# Patient Record
Sex: Male | Born: 1970 | Race: White | Hispanic: Yes | Smoking: Never smoker
Health system: Southern US, Community
[De-identification: ages and names within clinical notes are randomized; demographics above are authoritative.]

## PROBLEM LIST (undated history)

## (undated) DIAGNOSIS — J45909 Unspecified asthma, uncomplicated: Secondary | ICD-10-CM

## (undated) DIAGNOSIS — M5441 Lumbago with sciatica, right side: Secondary | ICD-10-CM

## (undated) DIAGNOSIS — F419 Anxiety disorder, unspecified: Secondary | ICD-10-CM

## (undated) DIAGNOSIS — R55 Syncope and collapse: Secondary | ICD-10-CM

## (undated) DIAGNOSIS — M5442 Lumbago with sciatica, left side: Secondary | ICD-10-CM

## (undated) HISTORY — DX: Lumbago with sciatica, right side: M54.42

## (undated) HISTORY — DX: Anxiety disorder, unspecified: F41.9

## (undated) HISTORY — DX: Unspecified asthma, uncomplicated: J45.909

## (undated) HISTORY — DX: Syncope and collapse: R55

## (undated) HISTORY — DX: Lumbago with sciatica, right side: M54.41

---

## 2016-02-18 ENCOUNTER — Ambulatory Visit: Payer: Medicaid Other | Admitting: Family Medicine

## 2016-03-01 ENCOUNTER — Other Ambulatory Visit: Payer: Self-pay

## 2016-03-01 ENCOUNTER — Ambulatory Visit: Payer: Medicaid Other | Attending: Family Medicine | Admitting: Family Medicine

## 2016-03-01 ENCOUNTER — Encounter: Payer: Self-pay | Admitting: Family Medicine

## 2016-03-01 VITALS — BP 104/66 | HR 73 | Temp 98.1°F | Resp 18 | Ht 66.0 in | Wt 151.2 lb

## 2016-03-01 DIAGNOSIS — J45909 Unspecified asthma, uncomplicated: Secondary | ICD-10-CM | POA: Diagnosis not present

## 2016-03-01 DIAGNOSIS — Z8709 Personal history of other diseases of the respiratory system: Secondary | ICD-10-CM

## 2016-03-01 DIAGNOSIS — K219 Gastro-esophageal reflux disease without esophagitis: Secondary | ICD-10-CM | POA: Diagnosis not present

## 2016-03-01 DIAGNOSIS — R1013 Epigastric pain: Secondary | ICD-10-CM | POA: Diagnosis present

## 2016-03-01 DIAGNOSIS — R55 Syncope and collapse: Secondary | ICD-10-CM | POA: Diagnosis not present

## 2016-03-01 LAB — CBC
HEMATOCRIT: 48.1 % (ref 38.5–50.0)
HEMOGLOBIN: 15.6 g/dL (ref 13.2–17.1)
MCH: 30.8 pg (ref 27.0–33.0)
MCHC: 32.4 g/dL (ref 32.0–36.0)
MCV: 94.9 fL (ref 80.0–100.0)
MPV: 9.9 fL (ref 7.5–12.5)
Platelets: 261 10*3/uL (ref 140–400)
RBC: 5.07 MIL/uL (ref 4.20–5.80)
RDW: 12.4 % (ref 11.0–15.0)
WBC: 5.6 10*3/uL (ref 3.8–10.8)

## 2016-03-01 LAB — LIPID PANEL
CHOL/HDL RATIO: 3.9 ratio (ref <5.0)
CHOLESTEROL: 203 mg/dL — AB (ref <200)
HDL: 52 mg/dL (ref >40)
LDL CALC: 131 mg/dL — AB (ref <100)
Triglycerides: 100 mg/dL (ref <150)
VLDL: 20 mg/dL (ref <30)

## 2016-03-01 LAB — BASIC METABOLIC PANEL
BUN: 12 mg/dL (ref 7–25)
CALCIUM: 9.6 mg/dL (ref 8.6–10.3)
CHLORIDE: 102 mmol/L (ref 98–110)
CO2: 29 mmol/L (ref 20–31)
Creat: 0.96 mg/dL (ref 0.60–1.35)
GLUCOSE: 90 mg/dL (ref 65–99)
Potassium: 4.2 mmol/L (ref 3.5–5.3)
SODIUM: 139 mmol/L (ref 135–146)

## 2016-03-01 LAB — LIPASE: LIPASE: 10 U/L (ref 7–60)

## 2016-03-01 MED ORDER — FLUTICASONE PROPIONATE (INHAL) 250 MCG/BLIST IN AEPB: 1.0000 | INHALATION_SPRAY | Freq: Two times a day (BID) | RESPIRATORY_TRACT | 2 refills | Status: DC

## 2016-03-01 MED ORDER — OMEPRAZOLE 20 MG PO CPDR: 20.0000 mg | DELAYED_RELEASE_CAPSULE | Freq: Every day | ORAL | 0 refills | Status: DC

## 2016-03-01 MED ORDER — ALBUTEROL SULFATE HFA 108 (90 BASE) MCG/ACT IN AERS: 2.0000 | INHALATION_SPRAY | RESPIRATORY_TRACT | 2 refills | Status: DC | PRN

## 2016-03-01 MED ORDER — ACETAMINOPHEN 500 MG PO TABS: 1000.0000 mg | ORAL_TABLET | Freq: Three times a day (TID) | ORAL | 0 refills | Status: DC

## 2016-03-01 NOTE — Patient Instructions (Signed)
Asma - Adultos (Asthma, Adult) El asma es una enfermedad de los pulmones en la que las vas respiratorias se estrechan y obstruyen. El asma puede causar dificultad para respirar. El asma no puede curarse, pero los medicamentos y los cambios en el estilo de vida pueden ayudar a controlarla. Los siguientes factores pueden iniciar (desencadenar) el asma:  Escamas de la piel de los animales (caspa).  Polvo.  Cucarachas.  Polen.  Moho.  Humo.  Productos de limpieza.  Aerosoles para el cabello.  Vapores de pintura u olores fuertes.  Aire fro, cambios climticos y vientos.  Llanto o risa intensos.  Estrs.  Ciertos medicamentos o drogas.  Alimentos, como frutas secas, papas fritas y vinos espumantes.  Infecciones o afecciones (resfros, gripe).  Haga actividad fsica.  Ciertas enfermedades o afecciones.  Ejercicio o actividades extenuantes. CUIDADOS EN EL HOGAR  Tome los medicamentos como le indic el mdico.  Use un medidor de flujo espiratorio mximo como le indic su mdico. El medidor de flujo espiratorio mximo es una herramienta que mide el funcionamiento de los pulmones.  Anote y lleve un registro de los valores del medidor de flujo espiratorio mximo.  Conozca el plan de accin para el asma y selo. El plan de accin para el asma es una planificacin por escrito para el control y tratamiento de sus crisis asmticas.  Para prevenir las crisis asmticas: ? No fume. Aljese del humo de otros fumadores. ? Cambie el filtro de la calefaccin y del aire acondicionado con frecuencia. ? Limite el uso de hogares o estufas a lea. ? Elimine las plagas (como cucarachas, ratones) y sus excrementos. ? Elimine las plantas si observa moho en ellas. ? Limpie los pisos. Elimine el polvo regularmente. Use productos de limpieza que sean inoloros. ? Pdale a alguien que pase la aspiradora cuando usted no se encuentre en casa. Utilice una aspiradora con filtros HEPA, siempre que  le sea posible. ? Reemplace las alfombras por pisos de madera, baldosas o vinilo. Las alfombras pueden retener las escamas de la piel de los animales y el polvo. ? Use almohadas, mantas y cubre colchones antialrgicos. ? Lave las sbanas y las mantas todas las semanas con agua caliente y squelas con aire caliente. ? Use mantas de polister o algodn. ? Limpie baos y cocinas con lavandina. Si fuera posible, pdale a alguien que vuelva a pintar las paredes de estas habitaciones con una pintura resistente a los hongos. Aljese de las habitaciones que se estn limpiando y pintando. ? Lvese las manos con frecuencia.  SOLICITE AYUDA SI:  Hace un silbido al respirar (sibilancias), tiene falta de aire o tiene tos incluso despus de tomar los medicamentos para prevenir crisis.  El moco coloreado que elimina (esputo) es ms denso de lo normal.  El moco coloreado que elimina cambia de trasparente o blanco a amarillo, verde, gris o sanguinolento.  Tiene problemas causados por el medicamento que toma, por ejemplo: ? Una erupcin. ? Picazn. ? Hinchazn. ? Problemas respiratorios.  Necesita un medicamento de alivio ms de 2 a 3veces por semana.  El flujo espiratorio mximo an est en 50 a 79% del mejor valor personal despus de seguir el plan de accin durante 1hora.  Tiene fiebre.  SOLICITE AYUDA DE INMEDIATO SI:  Parece empeorar y no responde al medicamento durante una crisis asmtica.  Le falta el aire, incluso en reposo.  Le falta el aire incluso cuando hace muy poca actividad fsica.  Tiene dificultad para comer, beber o hablar.  Siente   el pecho.  La frecuencia cardaca est acelerada.  Sus labios o uas comienzan a Environmental education officer.  Usted se siente mareado, dbil o se desmaya.  Su flujo mximo es Garment/textile technologist del 50% del Pharmacist, hospital personal. Esta informacin no tiene Marine scientist el consejo del mdico. Asegrese de hacerle al mdico cualquier pregunta que  tenga. Document Released: 05/26/2008 Document Revised: 11/18/2014 Document Reviewed: 09/26/2012 Elsevier Interactive Patient Education  2017 Junior de alimentos para pacientes con reflujo gastroesofgico - Adultos (Food Choices for Gastroesophageal Reflux Disease, Adult) Cuando se tiene reflujo gastroesofgico (ERGE), los alimentos que se ingieren y los hbitos de alimentacin son Theatre stage manager. Elegir los alimentos adecuados puede ayudar a [employer]. [place]?  Elija las frutas, los vegetales, los cereales integrales y los productos lcteos con bajo contenido de [place].  [place], de pescado y de ave con bajo contenido de grasas.  Limite las grasas, como los [place], los aderezos para [place], la [place], los frutos secos y Publishing copy.  Lleve un registro de alimentos. Esto ayuda a identificar los alimentos que ocasionan sntomas.  Evite los alimentos que le ocasionen sntomas. Pueden ser distintos para cada persona.  Haga comidas pequeas durante Psychiatrist de 3 comidas abundantes.  Coma lentamente, en un lugar donde est distendido.  Limite el consumo de alimentos fritos.  Cocine los alimentos utilizando mtodos que no sean la fritura.  Evite el consumo alcohol.  Evite beber grandes cantidades de lquidos con las comidas.  Evite agacharse o recostarse hasta despus de 2 o 3horas de haber comido. QU ALIMENTOS NO SE RECOMIENDAN? Estos son algunos alimentos y bebidas que pueden empeorar los sntomas: Actor. Jugo de tomate. Salsa de tomate y espagueti. Ajes. Cebolla y [place]. Rbano picante. Frutas  Naranjas, pomelos y limn (fruta y [place]). Carnes  Carnes de [place], de pescado y de ave con gran contenido de grasas. Esto incluye los perros calientes, las [place], el [place], la salchicha, el salame y el tocino. Lcteos  Leche entera y [place]. [employer]. Crema. [place]. Helados. Queso  crema. Bebidas  T o caf. Bebidas gaseosas o bebidas energizantes. Condimentos  Salsa picante. Salsa barbacoa. Dulces/postres  Chocolate y cacao. Rosquillas. Menta y mentol. Grasas y [employer]. Esto incluye las papas fritas. Otros  [place]. Especias picantes. Esto incluye la pimienta negra, la pimienta blanca, la pimienta roja, la pimienta de cayena, el curry en polvo, los clavos de [place], el jengibre y el [place] en polvo. Esta no es [employer] de los alimentos y las bebidas que se Higher education careers adviser. Comunquese con el nutricionista para recibir ms informacin.  Esta informacin no tiene Marine scientist el consejo del mdico. Asegrese de hacerle al mdico cualquier pregunta que tenga. Document Released: 08/29/2011 Document Revised: 03/20/2014 Document Reviewed: 01/01/2013 Elsevier Interactive Patient Education  2017 [employer].

## 2016-03-01 NOTE — Progress Notes (Signed)
Subjective:  Patient ID: Derek Smith, male    DOB: 08-28-1970  Age: 45 y.o. MRN: [id number]  CC: Establish Care  HPI Angeldaniel Pepitone comes to the office to establish care. He has c/o epigastric abdominal pain for 7 months. He reports pain increases after he eats pizza, spicy, or greasy foods. He also reports foul-smelling breath. He also has complaints of occasional chest pain with episodes of syncope. He reports these symptoms have been happening for 3 years. He reports when he has the pain it radiates to his left arm and down his back causing weakness.He also reports a past medical history of asthma. He denies having any medications for asthma available and reports he has been using his wife's albuterol and flovent inhalers the past 2 weeks. He denies smoking.  No outpatient prescriptions prior to visit.   No facility-administered medications prior to visit.     ROS Review of Systems  Constitutional: Negative.   HENT: Negative for trouble swallowing.   Respiratory:       Pt.reports hx.of asthma.  Cardiovascular:       Pt.reports 3 year history of occasional syncopal episodes with chest pain    Objective:  BP 104/66 (BP Location: Right Arm, Patient Position: Sitting, Cuff Size: Normal)   Pulse 73   Temp 98.1 F (36.7 C) (Oral)   Resp 18   Ht 5\' 6"  (1.676 m)   Wt 151 lb 3.2 oz (68.6 kg)   SpO2 98%   BMI 24.40 kg/m   BP/Weight [id number]  Systolic BP 123456  Diastolic BP 66  Wt. (Lbs) 151.2  BMI 24.4   Physical Exam  Constitutional: He appears well-developed and well-nourished.  Neck: Normal range of motion. No JVD present.  Cardiovascular: Normal rate, regular rhythm, normal heart sounds and intact distal pulses.   Pulmonary/Chest: Effort normal and breath sounds normal. No respiratory distress. He has no wheezes.  Abdominal: Soft. Bowel sounds are normal. There is tenderness (epigastric).  Pulsations noted mid- abdomen.   Assessment & Plan:   1. Syncope, unspecified  syncope type - EKG [id number] - Basic Metabolic Panel - [place] AAA DUPLEX COMPLETE; Future  2. Abdominal pain, epigastric - Basic Metabolic Panel - CBC - [place] AAA DUPLEX COMPLETE; Future - Lipid Panel - Lipase - H.pylori screen, POC; Future  3. Gastroesophageal reflux disease, esophagitis presence not specified - omeprazole (PRILOSEC) 20 MG capsule; Take 1 capsule (20 mg total) by mouth daily.  Dispense: 30 capsule; Refill: 0  4. History of asthma - albuterol (PROVENTIL HFA;VENTOLIN HFA) 108 (90 Base) MCG/ACT inhaler; Inhale 2 puffs into the lungs every 4 (four) hours as needed for wheezing or shortness of breath.  Dispense: 1 Inhaler; Refill: 2 - Fluticasone Propionate, Inhal, (FLOVENT DISKUS) 250 MCG/BLIST AEPB; Inhale 1 Inhaler into the lungs 2 (two) times daily.  Dispense: 30 each; Refill: 2  Meds ordered this encounter  Medications  . DISCONTD: albuterol (PROVENTIL HFA;VENTOLIN HFA) 108 (90 Base) MCG/ACT inhaler    Sig: Inhale 2 puffs into the lungs every 4 (four) hours as needed for wheezing or shortness of breath.    Dispense:  1 Inhaler    Refill:  2    Order Specific Question:   Supervising Provider    Answer:   Tresa Garter [id number]  . DISCONTD: omeprazole (PRILOSEC) 20 MG capsule    Sig: Take 1 capsule (20 mg total) by mouth daily.    Dispense:  30 capsule    Refill:  0  Order Specific Question:   Supervising Provider    Answer:   Tresa Garter [id number]  . DISCONTD: Fluticasone Propionate, Inhal, (FLOVENT DISKUS) 250 MCG/BLIST AEPB    Sig: Inhale 1 Inhaler into the lungs 2 (two) times daily.    Dispense:  30 each    Refill:  2    Order Specific Question:   Supervising Provider    Answer:   Tresa Garter [id number]  . omeprazole (PRILOSEC) 20 MG capsule    Sig: Take 1 capsule (20 mg total) by mouth daily.    Dispense:  30 capsule    Refill:  0    Order Specific Question:   Supervising Provider    Answer:   Tresa Garter [id number]  .  albuterol (PROVENTIL HFA;VENTOLIN HFA) 108 (90 Base) MCG/ACT inhaler    Sig: Inhale 2 puffs into the lungs every 4 (four) hours as needed for wheezing or shortness of breath.    Dispense:  1 Inhaler    Refill:  2    Order Specific Question:   Supervising Provider    Answer:   Tresa Garter [id number]  . Fluticasone Propionate, Inhal, (FLOVENT DISKUS) 250 MCG/BLIST AEPB    Sig: Inhale 1 Inhaler into the lungs 2 (two) times daily.    Dispense:  30 each    Refill:  2    Order Specific Question:   Supervising Provider    Answer:   Tresa Garter [id number]  . acetaminophen (TYLENOL) 500 MG tablet    Sig: Take 2 tablets (1,000 mg total) by mouth every 8 (eight) hours.    Dispense:  90 tablet    Refill:  0    Order Specific Question:   Supervising Provider    Answer:   Tresa Garter [id number]   Follow-up: Return in about 1 month (around 04/01/2016) for GERD.   Alfonse Spruce FNP

## 2016-03-01 NOTE — Progress Notes (Signed)
Patient is here to establish care.  Patient takes gabapentin.  Patient experienced lower back pain with pain radiating down his legs.

## 2016-03-03 ENCOUNTER — Encounter: Payer: Self-pay | Admitting: Family Medicine

## 2016-03-03 NOTE — Progress Notes (Signed)
Please inform patient of labs including his kidney, pancreas, fluid, blood cell and electrolytes are normal. Patient cholesterol level is slightly high and he should limit his saturated fat intake and increase his fiber and exercise.

## 2016-03-10 ENCOUNTER — Telehealth: Payer: Self-pay

## 2016-03-10 NOTE — Telephone Encounter (Signed)
CMA left a message that was trying to go over his results and left a number to call back.

## 2016-03-10 NOTE — Telephone Encounter (Signed)
Patient Verify DOB  Patient return phone call and I inform about his results being normal, and also that his Cholesterol level was slightly high and that he should limit his saturated fat and intake increase his fiber and exercise.

## 2016-03-10 NOTE — Telephone Encounter (Signed)
-----   Message from [place], [place] sent at 03/03/2016 12:02 PM EST ----- Please inform patient of labs including his kidney, pancreas, fluid, blood cell and electrolytes are normal. Patient cholesterol level is slightly high and he should limit his saturated fat intake and increase his fiber and exercise.

## 2016-03-15 ENCOUNTER — Ambulatory Visit (HOSPITAL_COMMUNITY): Payer: Medicaid Other

## 2016-03-15 ENCOUNTER — Encounter (HOSPITAL_COMMUNITY): Payer: Self-pay

## 2016-03-15 ENCOUNTER — Other Ambulatory Visit: Payer: Self-pay | Admitting: Family Medicine

## 2016-03-15 ENCOUNTER — Ambulatory Visit (HOSPITAL_COMMUNITY)
Admission: RE | Admit: 2016-03-15 | Discharge: 2016-03-15 | Disposition: A | Discharge status: Home / Self Care | Payer: Medicaid Other | Source: Ambulatory Visit | Attending: Family Medicine | Admitting: Family Medicine

## 2016-03-15 DIAGNOSIS — R079 Chest pain, unspecified: Secondary | ICD-10-CM

## 2016-03-15 DIAGNOSIS — R55 Syncope and collapse: Secondary | ICD-10-CM

## 2016-03-15 DIAGNOSIS — R1013 Epigastric pain: Secondary | ICD-10-CM

## 2016-03-15 DIAGNOSIS — R0989 Other specified symptoms and signs involving the circulatory and respiratory systems: Secondary | ICD-10-CM

## 2016-03-17 ENCOUNTER — Ambulatory Visit (HOSPITAL_COMMUNITY): Admission: RE | Admit: 2016-03-17 | Payer: Medicaid Other | Source: Ambulatory Visit

## 2016-03-20 ENCOUNTER — Telehealth: Payer: Self-pay | Admitting: Family Medicine

## 2016-03-20 NOTE — Telephone Encounter (Signed)
Good Morning  [employer] former Medicaid denied the procedure US Aorta 04-27-15 @ 9:30am Please, contact [employer] if you want to appeal the decision [id number] or please, contact the patient and radiology [phone]  to cancel the appointment Thank You .  Based on [employer] [facility] [employer] Guidelines, we are unable to approve the requested procedure. There is an approval code for the same study, or a similar study, that is on file in the members records. This approval is still in effect. The clinical information submitted does not describe results of this prior imaging, or if there are plans to use this approval. The approved study may be sufficient for the evaluation of the current clinical condition. Guidelines do not support additional imaging without knowing the outcome of the prior approval and, therefore, the requested study is not indicated at this time.

## 2016-03-28 ENCOUNTER — Other Ambulatory Visit: Payer: Self-pay | Admitting: Family Medicine

## 2016-03-28 DIAGNOSIS — R55 Syncope and collapse: Secondary | ICD-10-CM

## 2016-03-28 HISTORY — DX: Syncope and collapse: R55

## 2016-04-11 NOTE — Telephone Encounter (Signed)
-----   Message from Alfonse Spruce, [place] sent at 03/28/2016  5:08 PM EST ----- Regarding: Scheduling for imaging studies Please notify patient that since his previous imaging study was denied by insurance I have placed orders for him to receive an echocardiogram and a carotid doppler study for his history of syncope (fainting). Ask what days of the week and time of day he prefers for his studies.

## 2016-04-11 NOTE — Telephone Encounter (Signed)
CMA call to get patient best time and day to scheduled an echocardiogram  Patient did not answer left a VM stating what I need and to call me back

## 2016-04-12 NOTE — Telephone Encounter (Signed)
Patient returned CMA's call stating that the best days to get echocardiogram are Wednesdays after 1pm. Please follow up.  Thank you.

## 2016-04-13 ENCOUNTER — Other Ambulatory Visit: Payer: Self-pay | Admitting: Family Medicine

## 2016-04-13 DIAGNOSIS — R55 Syncope and collapse: Secondary | ICD-10-CM

## 2016-04-13 NOTE — Telephone Encounter (Signed)
Okay I will change it now.

## 2016-04-13 NOTE — Telephone Encounter (Signed)
CMA call to inform him his echocardiogram/Cartoid Doppler appt on 02/14 @ [facility] first one @ 1pm the next one @ 2 pm  Patient did not answer but cma left a VM stating the date and time of the appt  And to call us back if have any questions'

## 2016-04-26 ENCOUNTER — Ambulatory Visit (HOSPITAL_COMMUNITY)
Admission: RE | Admit: 2016-04-26 | Discharge: 2016-04-26 | Disposition: A | Discharge status: Home / Self Care | Payer: Medicaid Other | Source: Ambulatory Visit | Attending: Family Medicine | Admitting: Family Medicine

## 2016-04-26 ENCOUNTER — Encounter (HOSPITAL_COMMUNITY): Payer: Self-pay | Admitting: Family Medicine

## 2016-04-26 ENCOUNTER — Ambulatory Visit (HOSPITAL_BASED_OUTPATIENT_CLINIC_OR_DEPARTMENT_OTHER)
Admission: RE | Admit: 2016-04-26 | Discharge: 2016-04-26 | Disposition: A | Discharge status: Home / Self Care | Payer: Medicaid Other | Source: Ambulatory Visit | Attending: Family Medicine | Admitting: Family Medicine

## 2016-04-26 ENCOUNTER — Other Ambulatory Visit: Payer: Self-pay | Admitting: Family Medicine

## 2016-04-26 ENCOUNTER — Ambulatory Visit: Payer: Medicaid Other | Attending: Physician Assistant | Admitting: Physician Assistant

## 2016-04-26 ENCOUNTER — Ambulatory Visit (HOSPITAL_COMMUNITY): Payer: Medicaid Other

## 2016-04-26 VITALS — BP 119/74 | HR 74 | Temp 98.4°F | Ht 68.0 in | Wt 154.0 lb

## 2016-04-26 DIAGNOSIS — Z79899 Other long term (current) drug therapy: Secondary | ICD-10-CM | POA: Insufficient documentation

## 2016-04-26 DIAGNOSIS — R55 Syncope and collapse: Secondary | ICD-10-CM | POA: Diagnosis not present

## 2016-04-26 DIAGNOSIS — I5189 Other ill-defined heart diseases: Secondary | ICD-10-CM | POA: Insufficient documentation

## 2016-04-26 DIAGNOSIS — M5442 Lumbago with sciatica, left side: Secondary | ICD-10-CM

## 2016-04-26 DIAGNOSIS — K219 Gastro-esophageal reflux disease without esophagitis: Secondary | ICD-10-CM

## 2016-04-26 DIAGNOSIS — I361 Nonrheumatic tricuspid (valve) insufficiency: Secondary | ICD-10-CM | POA: Insufficient documentation

## 2016-04-26 DIAGNOSIS — F419 Anxiety disorder, unspecified: Secondary | ICD-10-CM

## 2016-04-26 LAB — POCT URINALYSIS DIPSTICK
Bilirubin, UA: NEGATIVE
Glucose, UA: NEGATIVE
Ketones, UA: NEGATIVE
Leukocytes, UA: NEGATIVE
Nitrite, UA: NEGATIVE
Protein, UA: NEGATIVE
SPEC GRAV UA: 1.015
UROBILINOGEN UA: 0.2
pH, UA: 6.5

## 2016-04-26 MED ORDER — PREDNISONE 10 MG (21) PO TBPK: 10.0000 mg | ORAL_TABLET | Freq: Every day | ORAL | 0 refills | Status: DC

## 2016-04-26 MED ORDER — CLONAZEPAM 0.5 MG PO TABS: 0.5000 mg | ORAL_TABLET | Freq: Two times a day (BID) | ORAL | 1 refills | Status: DC | PRN

## 2016-04-26 MED ORDER — ESCITALOPRAM OXALATE 10 MG PO TABS: 10.0000 mg | ORAL_TABLET | Freq: Every day | ORAL | 1 refills | Status: DC

## 2016-04-26 MED ORDER — ACETAMINOPHEN 500 MG PO TABS: 1000.0000 mg | ORAL_TABLET | Freq: Three times a day (TID) | ORAL | 0 refills | Status: AC | PRN

## 2016-04-26 NOTE — Patient Instructions (Signed)
   Trastorno de ansiedad generalizada (Generalized Anxiety Disorder) El trastorno de ansiedad generalizada es un trastorno mental. Interfiere en las funciones vitales, incluyendo las relaciones, el trabajo y la escuela.  Es diferente de la ansiedad normal que todas las personas experimentan en algn momento de su vida en respuesta a sucesos y actividades especficas. En verdad, la ansiedad normal nos ayuda a prepararnos y atravesar estos acontecimientos y actividades de la vida. La ansiedad normal desaparece despus de que el evento o la actividad ha finalizado.  El trastorno de ansiedad generalizada no est necesariamente relacionada con eventos o actividades especficas. Tambin causa un exceso de ansiedad en proporcin a sucesos o actividades especficas. En este trastorno la ansiedad es difcil de controlar. Los sntomas pueden variar de leves a muy graves. Las personas que sufren de trastorno de ansiedad generalizada pueden tener intensas olas de ansiedad con sntomas fsicos (ataques de pnico).  SNTOMAS  La ansiedad y la preocupacin asociada a este trastorno son difciles de controlar. Esta ansiedad y la preocupacin estn relacionados con muchos eventos de la vida y sus actividades y tambin ocurre durante ms das de los que no ocurre, durante 6 meses o ms. Las personas que la sufren pueden tener tres o ms de los siguientes sntomas (uno o ms en los nios):   Agitacin   Fatiga.  Dificultades de concentracin.   Irritabilidad.  Tensin muscular  Dificultad para dormirse o sueo poco satisfactorio. DIAGNSTICO  Se diagnostica a travs de una evaluacin realizada por el mdico. El mdico le har preguntas acerca de su estado de nimo, sntomas fsicos y sucesos de su vida. Le har preguntas sobre su historia clnica, el consumo de alcohol o drogas, incluyendo los medicamentos recetados. Tambin le har un examen fsico e indicar anlisis de sangre. Ciertas enfermedades y el uso de  determinadas sustancias pueden causar sntomas similares a este trastorno. Su mdico lo puede derivar a un especialista en salud mental para una evaluacin ms profunda..  TRATAMIENTO  Las terapias siguientes se utilizan en el tratamiento de este trastorno:   Medicamentos - Se recetan antidepresivos para el control diario a largo plazo. Pueden indicarse tambin medicamentos para combatir la ansiedad en los casos graves, especialmente cuando ocurren ataques de pnico.   Terapia conversada (psicoterapia) Ciertos tipos de psicoterapia pueden ser tiles en el tratamiento del trastorno de ansiedad generalizada, proporcionando apoyo, educacin y orientacin. Una forma de psicoterapia llamada terapia cognitivo-conductual puede ensearle formas saludables de pensar y reaccionar a los eventos y actividades de la vida diaria.  Tcnicasde manejo del estrs- Estas tcnicas incluyen el yoga, la meditacin y el ejercicio y pueden ser muy tiles cuando se practican con regularidad. Un especialista en salud mental puede ayudar a determinar qu tratamiento es mejor para usted. Algunas personas obtienen mejora con una terapia. Sin embargo, otras personas requieren una combinacin de terapias.  Esta informacin no tiene como fin reemplazar el consejo del mdico. Asegrese de hacerle al mdico cualquier pregunta que tenga. Document Released: 06/24/2012 Document Revised: 03/20/2014 Elsevier Interactive Patient Education  2017 Elsevier Inc.  

## 2016-04-26 NOTE — Progress Notes (Signed)
Subjective:  Patient ID: Derek Smith, male    DOB: [birth year]  Age: 46 y.o. MRN: [id number]  CC: back pain  HPI Derek Smith is a 46 y.o. male with a PMH of Anxiety and syncope that presents with several weeks of lower back pain with radiculopathy in the posterior aspect of left leg down to the mid hamstring. There is also some weakness in the left leg at times of pain. Radicular pain is sometimes felt anteriorly to the upper quadriceps. He is able to ambulate but with pain. Currently has mild discomfort in the lower back. 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 There is associated stiffness with the back pain. Denies GI/GU sxs, saddle paresthesia, paralysis, rash, fever, chills, abdominal pain, chest pain, SOB, or headache.    Outpatient Medications Prior to Visit  Medication Sig Dispense Refill  . albuterol (PROVENTIL HFA;VENTOLIN HFA) 108 (90 Base) MCG/ACT inhaler Inhale 2 puffs into the lungs every 4 (four) hours as needed for wheezing or shortness of breath. 1 Inhaler 2  . Fluticasone Propionate, Inhal, (FLOVENT DISKUS) 250 MCG/BLIST AEPB Inhale 1 Inhaler into the lungs 2 (two) times daily. 30 each 2  . omeprazole (PRILOSEC) 20 MG capsule Take 1 capsule (20 mg total) by mouth daily. 30 capsule 0  . acetaminophen (TYLENOL) 500 MG tablet Take 2 tablets (1,000 mg total) by mouth every 8 (eight) hours. 90 tablet 0   No facility-administered medications prior to visit.      ROS Review of Systems  Constitutional: Negative for chills, fever and malaise/fatigue.  Respiratory: Negative for shortness of breath.   Cardiovascular: Negative for chest pain.  Gastrointestinal: Negative for abdominal pain, nausea and vomiting.  Genitourinary: Negative for dysuria.  Musculoskeletal: Positive for back pain.  Skin: Negative for rash.  Neurological: Positive for focal weakness. Negative for headaches.       Left sided sciatic radiculopathy  Psychiatric/Behavioral: The patient is  nervous/anxious.     Objective:  BP 119/74   Pulse 74   Temp 98.4 F (36.9 C) (Oral)   Ht 5\' 8"  (1.727 m)   Wt 154 lb (69.9 kg)   SpO2 98%   BMI 23.42 kg/m   BP/Weight 04/26/2016 [id number]  Systolic BP 123456 123456  Diastolic BP 74 66  Wt. (Lbs) 154 151.2  BMI 23.42 24.4      Physical Exam  Constitutional: He is oriented to person, place, and time.  NAD, normal body habitus, polite  HENT:  Head: Normocephalic and atraumatic.  Eyes: No scleral icterus.  Neck: No thyromegaly present.  Cardiovascular: Normal rate, regular rhythm and normal heart sounds.   Pulmonary/Chest: Effort normal and breath sounds normal.  Musculoskeletal: He exhibits no edema.  Mildly increased muscular tonicity in the paraspinals of the lower T spine and also in the L spine  Neurological: He is alert and oriented to person, place, and time.  SLR positive on left side  Skin: Skin is warm and dry.  Psychiatric: He has a normal mood and affect. His behavior is normal. Thought content normal.     Assessment & Plan:   1. Acute bilateral low back pain with left-sided sciatica - Follow up in three weeks. Will consider PT if no better - predniSONE (STERAPRED UNI-PAK 21 TAB) 10 MG (21) TBPK tablet; Take 1 tablet (10 mg total) by mouth daily.  Dispense: 21 tablet; Refill: 0 - acetaminophen (TYLENOL) 500 MG tablet; Take 2 tablets (1,000 mg total) by mouth every 8 (eight)  hours as needed.  Dispense: 30 tablet; Refill: 0 - Urinalysis Dipstick  2. Anxiety - Previously untreated. GAD 7 score of 10. Follow up in 3 weeks to assess efficacy of pharmacotherapy. - clonazePAM (KLONOPIN) 0.5 MG tablet; Take 1 tablet (0.5 mg total) by mouth 2 (two) times daily as needed for anxiety.  Dispense: 20 tablet; Refill: 1 - escitalopram (LEXAPRO) 10 MG tablet; Take 1 tablet (10 mg total) by mouth daily.  Dispense: 30 tablet; Refill: 1   Meds ordered this encounter  Medications  . clonazePAM (KLONOPIN) 0.5 MG tablet    Sig:  Take 1 tablet (0.5 mg total) by mouth 2 (two) times daily as needed for anxiety.    Dispense:  20 tablet    Refill:  1    Order Specific Question:   Supervising Provider    Answer:   Tresa Garter [id number]  . escitalopram (LEXAPRO) 10 MG tablet    Sig: Take 1 tablet (10 mg total) by mouth daily.    Dispense:  30 tablet    Refill:  1    Order Specific Question:   Supervising Provider    Answer:   Tresa Garter [id number]  . predniSONE (STERAPRED UNI-PAK 21 TAB) 10 MG (21) TBPK tablet    Sig: Take 1 tablet (10 mg total) by mouth daily.    Dispense:  21 tablet    Refill:  0    Order Specific Question:   Supervising Provider    Answer:   Tresa Garter [id number]  . acetaminophen (TYLENOL) 500 MG tablet    Sig: Take 2 tablets (1,000 mg total) by mouth every 8 (eight) hours as needed.    Dispense:  30 tablet    Refill:  0    Order Specific Question:   Supervising Provider    Answer:   Tresa Garter [id number]    Follow-up: Return in about 3 years (around 04/27/2019) for back pain and anxiety.   Clent Demark PA

## 2016-04-26 NOTE — Progress Notes (Signed)
  Echocardiogram 2D Echocardiogram has been performed.  Darlina Sicilian M 04/26/2016, 1:41 PM

## 2016-04-26 NOTE — Progress Notes (Signed)
Preliminary results by tech - Carotid Duplex Completed. No evidence of stenosis in bilateral carotid arteries. Vertebral arteries demonstrated antegrade flow.  Oda Cogan, BS, RDMS, RVT

## 2016-04-27 ENCOUNTER — Telehealth: Payer: Self-pay

## 2016-04-27 LAB — VAS US CAROTID
LCCADDIAS: 33 cm/s
LEFT ECA DIAS: -20 cm/s
LEFT VERTEBRAL DIAS: -21 cm/s
LICADDIAS: -26 cm/s
LICADSYS: -52 cm/s
LICAPSYS: -85 cm/s
Left CCA dist sys: 90 cm/s
Left CCA prox dias: 27 cm/s
Left CCA prox sys: 86 cm/s
Left ICA prox dias: -29 cm/s
RCCADSYS: -65 cm/s
RIGHT ECA DIAS: -22 cm/s
RIGHT VERTEBRAL DIAS: -11 cm/s
Right CCA prox dias: -17 cm/s
Right CCA prox sys: -75 cm/s

## 2016-04-27 NOTE — Telephone Encounter (Signed)
Pre authorization confirmation Fax  Case id # [id number]  Status Approved

## 2016-05-02 ENCOUNTER — Other Ambulatory Visit: Payer: Self-pay | Admitting: Family Medicine

## 2016-05-02 DIAGNOSIS — R9430 Abnormal result of cardiovascular function study, unspecified: Secondary | ICD-10-CM

## 2016-05-02 DIAGNOSIS — I071 Rheumatic tricuspid insufficiency: Secondary | ICD-10-CM

## 2016-05-03 ENCOUNTER — Telehealth: Payer: Self-pay

## 2016-05-03 NOTE — Telephone Encounter (Signed)
CMA call to inform echocardiogram results  Patient Verify DOB  Patient was aware and understood

## 2016-05-03 NOTE — Telephone Encounter (Signed)
-----   Message from Alfonse Spruce, [place] sent at 05/02/2016  7:42 PM EST ----- Echocardiogram showed the heart's pumping ability to be mildly reduced. It also showed one of the heart values doesn't close tight enough causing mild backflow in the heart chamber. You will be referred to cardiology. Caroid doppler test which evaluates blood flow to the brain and throughout the blood vessels is normal.

## 2016-05-17 ENCOUNTER — Encounter (HOSPITAL_COMMUNITY): Payer: Self-pay | Admitting: Emergency Medicine

## 2016-05-17 ENCOUNTER — Emergency Department (HOSPITAL_COMMUNITY)
Admission: EM | Admit: 2016-05-17 | Discharge: 2016-05-17 | Disposition: A | Discharge status: Home / Self Care | Payer: Medicaid Other | Attending: Emergency Medicine | Admitting: Emergency Medicine

## 2016-05-17 ENCOUNTER — Emergency Department (HOSPITAL_COMMUNITY): Payer: Medicaid Other

## 2016-05-17 DIAGNOSIS — X58XXXA Exposure to other specified factors, initial encounter: Secondary | ICD-10-CM | POA: Diagnosis not present

## 2016-05-17 DIAGNOSIS — Y9289 Other specified places as the place of occurrence of the external cause: Secondary | ICD-10-CM | POA: Diagnosis not present

## 2016-05-17 DIAGNOSIS — Y9389 Activity, other specified: Secondary | ICD-10-CM | POA: Insufficient documentation

## 2016-05-17 DIAGNOSIS — R079 Chest pain, unspecified: Secondary | ICD-10-CM | POA: Diagnosis not present

## 2016-05-17 DIAGNOSIS — Y990 Civilian activity done for income or pay: Secondary | ICD-10-CM | POA: Diagnosis not present

## 2016-05-17 LAB — CBC
HEMATOCRIT: 41.7 % (ref 39.0–52.0)
HEMOGLOBIN: 14.6 g/dL (ref 13.0–17.0)
MCH: 31.1 pg (ref 26.0–34.0)
MCHC: 35 g/dL (ref 30.0–36.0)
MCV: 88.7 fL (ref 78.0–100.0)
Platelets: 213 10*3/uL (ref 150–400)
RBC: 4.7 MIL/uL (ref 4.22–5.81)
RDW: 12 % (ref 11.5–15.5)
WBC: 6 10*3/uL (ref 4.0–10.5)

## 2016-05-17 LAB — BASIC METABOLIC PANEL
Anion gap: 8 (ref 5–15)
BUN: 15 mg/dL (ref 6–20)
CALCIUM: 9.7 mg/dL (ref 8.9–10.3)
CO2: 27 mmol/L (ref 22–32)
Chloride: 101 mmol/L (ref 101–111)
Creatinine, Ser: 1 mg/dL (ref 0.61–1.24)
GFR calc Af Amer: >60 mL/min (ref >60)
Glucose, Bld: 89 mg/dL (ref 65–99)
POTASSIUM: 3.5 mmol/L (ref 3.5–5.1)
Sodium: 136 mmol/L (ref 135–145)

## 2016-05-17 LAB — I-STAT TROPONIN, ED
Troponin i, poc: 0 ng/mL (ref 0.00–0.08)
Troponin i, poc: 0 ng/mL (ref 0.00–0.08)

## 2016-05-17 MED ORDER — ASPIRIN 325 MG PO TABS
325.0000 mg | ORAL_TABLET | Freq: Once | ORAL | Status: AC
  Administered 2016-05-17: 325 mg via ORAL
  Filled 2016-05-17: qty 1

## 2016-05-17 NOTE — ED Triage Notes (Signed)
Wife stated, he has chest pain and left arm pain for 6 months. He has a doctor's appt tomorrow with Dr. Lynden Oxford

## 2016-05-17 NOTE — ED Notes (Signed)
Pt stable, understands discharge instructions, and reasons for return.   

## 2016-05-17 NOTE — ED Provider Notes (Signed)
[facility] DEPT Provider Note   CSN: [id number] Arrival date & time: 05/17/16  1653     History   Chief Complaint Chief Complaint  Patient presents with  . Chest Pain    HPI Ori Geraldine Contras is a 46 y.o. male.  HPI   Patient is a 46 year old male with history of anxiety and syncope presents the ED with complaint of chest pain, onset 4pm. 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 He reports associated tingling sensation to his left arm and lightheadedness. Patient states the pain was constant and worse with exertion. Denies any alleviating factors. He notes the pain and tingling have improved since arrival to the ED. Denies taking any medications prior to arrival. He also reports having mild intermittent nonproductive cough for the past 2 days. Patient reports having similar intermittent episodes of chest pain over the past 6 months. He notes he has been evaluated by his cardiologist appointment scheduled for tomorrow. Denies fever, chills, headache, shortness of breath, wheezing, palpitations, diaphoresis, abdominal pain, nausea, vomiting, leg swelling. Patient denies personal history of cardiac disease but reports his mother has "heart problems". Denies smoking. Patient reports being evaluated twice person couple episodes at hospitals in [place] where he is from. Reports having negative workup.  PCP- Dr. Braulio Conte Cardiologist- Dr. Lynden Oxford  Past Medical History:  Diagnosis Date  . Anxiety   . 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 Syncope 03/28/2016    Patient Active Problem List   Diagnosis Date Noted  . Syncope 03/28/2016    Past Surgical History:  Procedure Laterality Date  . NO PAST SURGERIES         Home Medications    Prior to Admission medications   Medication Sig Start Date End Date Taking? Authorizing Provider  albuterol (PROVENTIL  HFA;VENTOLIN HFA) 108 (90 Base) MCG/ACT inhaler Inhale 2 puffs into the lungs every 4 (four) hours as needed for wheezing or shortness of breath. 03/01/16   Alfonse Spruce, FNP  clonazePAM (KLONOPIN) 0.5 MG tablet Take 1 tablet (0.5 mg total) by mouth 2 (two) times daily as needed for anxiety. 04/26/16   Roger Roderic Ovens, PA-C  escitalopram (LEXAPRO) 10 MG tablet Take 1 tablet (10 mg total) by mouth daily. 04/26/16   Roger Roderic Ovens, PA-C  Fluticasone Propionate, Inhal, (FLOVENT DISKUS) 250 MCG/BLIST AEPB Inhale 1 Inhaler into the lungs 2 (two) times daily. 03/01/16   Alfonse Spruce, FNP  omeprazole (PRILOSEC) 20 MG capsule TAKE 1 CAPSULE BY MOUTH DAILY. 04/27/16   Tresa Garter, MD  predniSONE (STERAPRED UNI-PAK 21 TAB) 10 MG (21) TBPK tablet Take 1 tablet (10 mg total) by mouth daily. 04/26/16   Clent Demark, PA-C    Family History Family History  Problem Relation Age of Onset  . GI problems Mother     Social History Social History  Substance Use Topics  . Smoking status: Never Smoker  . Smokeless tobacco: Never Used  . Alcohol use No     Allergies   Patient has no known allergies.   Review of Systems Review of Systems  Respiratory: Positive for cough (mild, nonproductive).   Cardiovascular: Positive for chest pain.  Neurological: Positive for light-headedness.       Tingling  All other systems reviewed and are negative.    Physical Exam Updated Vital Signs BP 104/71   Pulse Marland Kitchen)  57   Temp 97.9 F (36.6 C) (Oral)   Resp 14   Ht 5\' 8"  (1.727 m)   Wt 69.4 kg   SpO2 98%   BMI 23.26 kg/m   Physical Exam  Constitutional: He is oriented to person, place, and time. He appears well-developed and well-nourished. No distress.  HENT:  Head: Normocephalic and atraumatic.  Mouth/Throat: Uvula is midline, oropharynx is clear and moist and mucous membranes are normal. No oropharyngeal exudate, posterior oropharyngeal edema, posterior oropharyngeal erythema  or tonsillar abscesses. No tonsillar exudate.  Eyes: Conjunctivae and EOM are normal. Right eye exhibits no discharge. Left eye exhibits no discharge. No scleral icterus.  Neck: Normal range of motion. Neck supple.  Cardiovascular: Normal rate, regular rhythm, normal heart sounds and intact distal pulses.   Pulmonary/Chest: Effort normal and breath sounds normal. No respiratory distress. He has no wheezes. He has no rales. He exhibits tenderness (mild TTP over left anterior chest wall). He exhibits no laceration, no crepitus, no edema, no deformity, no swelling and no retraction.  Abdominal: Soft. Bowel sounds are normal. He exhibits no distension and no mass. There is no tenderness. There is no rebound and no guarding. No hernia.  Musculoskeletal: Normal range of motion. He exhibits no edema or tenderness.  Full range of motion of bilateral upper and lower extremities, with 5/5 strength. Sensation intact. 2+ radial and PT pulses. Cap refill <2 seconds.   Neurological: He is alert and oriented to person, place, and time. No sensory deficit.  Skin: Skin is warm and dry. He is not diaphoretic.  Nursing note and vitals reviewed.    ED Treatments / Results  Labs (all labs ordered are listed, but only abnormal results are displayed) Labs Reviewed  BASIC METABOLIC PANEL  CBC  I-STAT [place], ED  I-STAT [place], ED    EKG  EKG Interpretation  Date/Time:  Wednesday May 17 2016 17:03:48 EST Ventricular Rate:  83 PR Interval:  140 QRS Duration: 86 QT Interval:  352 QTC Calculation: 413 R Axis:   77 Text Interpretation:  Normal sinus rhythm Possible Left atrial enlargement Left ventricular hypertrophy with repolarization abnormality Abnormal ECG agree. no sig change from previous. Confirmed by Johnney Killian, MD, Jeannie Done [phone]) on 05/17/2016 8:20:17 PM       Radiology Dg Chest 2 View  Result Date: 05/17/2016 CLINICAL DATA:  Left chest pain, left arm pain and numbness EXAM: CHEST  2 VIEW  COMPARISON:  None. FINDINGS: The heart size and mediastinal contours are within normal limits. Both lungs are clear. The visualized skeletal structures are unremarkable. IMPRESSION: No active cardiopulmonary disease. Electronically Signed   By: Kathreen Devoid   On: 05/17/2016 17:29    Procedures Procedures (including critical care time)  Medications Ordered in ED Medications  aspirin tablet 325 mg (325 mg Oral Given 05/17/16 1935)     Initial Impression / Assessment and Plan / ED Course  I have reviewed the triage vital signs and the nursing notes.  Pertinent labs & imaging results that were available during my care of the patient were reviewed by me and considered in my medical decision making (see chart for details).     46 year old male with history of anxiety and syncope presents to the ED with complaint of intermittent episodes of chest pain for the past 6 months. 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 Pain radiated to left arm. Pain worse with exertion. Denies personal history of cardiac  disease. Reports mother with heart issues. Denies smoking. VSS. Exam revealed mild tenderness over left anterior chest wall. Lungs clear to auscultation bilaterally. Bilateral upper extremities are neurovascularly intact. Remaining exam unremarkable. Patient given aspirin in the ED. EKG shows sinus rhythm with LVH, no significant change from prior. Troponin negative. Labs unremarkable. Chest x-ray negative. HEART score 3. Delta troponin negative. On reevaluation patient reports his pain has significantly improved since arrival to the ED. I have a low suspicion for ACS, PE, dissection, or other acute cardiac event at this time. Patient states he has an appointment scheduled with his cardiologist, Dr. Lynden Oxford [place] Lack?) for tomorrow afternoon. Discussed results and plan for discharge with patient. Advised patient to follow up with his cardiologist  tomorrow for further evaluation. Discussed return precautions. Patient has remained hemodynamically stable while in the ED.    Final Clinical Impressions(s) / ED Diagnoses   Final diagnoses:  Chest pain, unspecified type    New Prescriptions New Prescriptions   No medications on file     Nona Dell, PA-C 05/17/16 [place], MD 05/21/16 1941

## 2016-05-17 NOTE — Discharge Instructions (Signed)
Continue taking your home medication as prescribed.  Follow up with your cardiologist at your scheduled appointment tomorrow afternoon for further evaluation. Please return to the Emergency Department if symptoms worsen or new onset of fever, dizziness, new/worsening chest pain, difficulty breathing, abdominal pain, vomiting, numbness, weakness, syncope.

## 2016-05-18 ENCOUNTER — Encounter: Payer: Self-pay | Admitting: Interventional Cardiology

## 2016-05-18 ENCOUNTER — Ambulatory Visit (INDEPENDENT_AMBULATORY_CARE_PROVIDER_SITE_OTHER): Payer: Medicaid Other | Admitting: Interventional Cardiology

## 2016-05-18 VITALS — BP 108/64 | HR 71 | Ht 68.0 in | Wt 147.4 lb

## 2016-05-18 DIAGNOSIS — I209 Angina pectoris, unspecified: Secondary | ICD-10-CM

## 2016-05-18 DIAGNOSIS — Z8249 Family history of ischemic heart disease and other diseases of the circulatory system: Secondary | ICD-10-CM

## 2016-05-18 MED ORDER — ISOSORBIDE MONONITRATE ER 30 MG PO TB24: 30.0000 mg | ORAL_TABLET | Freq: Every day | ORAL | 3 refills | Status: DC

## 2016-05-18 MED ORDER — NITROGLYCERIN 0.4 MG SL SUBL
0.4000 mg | SUBLINGUAL_TABLET | SUBLINGUAL | 3 refills | Status: DC | PRN
Start: 2016-05-18 — End: 2018-02-21

## 2016-05-18 NOTE — Progress Notes (Signed)
Cardiology Office Note   Date:  05/18/2016   ID:  Derek Smith, DOB 07/22/1970, MRN [id number]  PCP:  Fredia Beets, FNP    No chief complaint on file. chest discomfort   Wt Readings from Last 3 Encounters:  05/18/16 147 lb 6.4 oz (66.9 kg)  05/17/16 153 lb (69.4 kg)  04/26/16 154 lb (69.9 kg)       History of Present Illness: Derek Smith is a 46 y.o. male  with no significant prior cardiac history. Over the last few months, he has noticed that with any exertion, he has a pressure in his chest. It goes away with rest. This has made his job difficult. Household chores of also become more difficult. Yesterday, symptoms got worse and he went to the emergency room. He had a negative workup. Troponin was negative along with EKG. He was sent home. He comes in today for further evaluation.  His wife reports that even small activities are making him very short of breath and getting a chest discomfort. He reports seeing yellow spots at time intermittently. He has some dizziness. He is very anxious as well. He has never had any other heart testing done. No associated sweating, lightheadedness or syncope. He has had some arm pain.  Medical history is obtained with the help of a Spanish interpreter.  Past Medical History:  Diagnosis Date  . Anxiety   . 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 Syncope 03/28/2016    Past Surgical History:  Procedure Laterality Date  . NO PAST SURGERIES       Current Outpatient Prescriptions  Medication Sig Dispense Refill  . albuterol (PROVENTIL HFA;VENTOLIN HFA) 108 (90 Base) MCG/ACT inhaler Inhale 2 puffs into the lungs every 4 (four) hours as needed for wheezing or shortness of breath. 1 Inhaler 2  . clonazePAM (KLONOPIN) 0.5 MG tablet Take 1 tablet (0.5 mg total) by mouth 2 (two) times daily as needed for anxiety. 20 tablet 1  . escitalopram (LEXAPRO) 10 MG tablet Take 1 tablet (10 mg total) by mouth daily. 30 tablet 1  .  Fluticasone Propionate, Inhal, (FLOVENT DISKUS) 250 MCG/BLIST AEPB Inhale 1 Inhaler into the lungs 2 (two) times daily. 30 each 2  . omeprazole (PRILOSEC) 20 MG capsule TAKE 1 CAPSULE BY MOUTH DAILY. 30 capsule 0  . predniSONE (STERAPRED UNI-PAK 21 TAB) 10 MG (21) TBPK tablet Take 1 tablet (10 mg total) by mouth daily. 21 tablet 0   No current facility-administered medications for this visit.     Allergies:   Patient has no known allergies.    Social History:  The patient  reports that he has never smoked. He has never used smokeless tobacco. He reports that he does not drink alcohol.   Family History:  The patient's family history includes GI problems in his mother. Mother had some type of heart problem in her 14s   ROS:  Please see the history of present illness.   Otherwise, review of systems are positive for chest discomfort.   All other systems are reviewed and negative.    PHYSICAL EXAM: VS:  BP 108/64   Pulse 71   Ht 5\' 8"  (1.727 m)   Wt 147 lb 6.4 oz (66.9 kg)   SpO2 98%   BMI 22.41 kg/m  , BMI Body mass index is 22.41 kg/m. GEN: Well nourished, well developed, in no acute distress  HEENT: normal  Neck: no JVD, carotid bruits, or masses Cardiac: RRR; no  murmurs, rubs, or gallops,no edema  Respiratory:  clear to auscultation bilaterally, normal work of breathing GI: soft, nontender, nondistended, + BS MS: no deformity or atrophy  Skin: warm and dry, no rash Neuro:  Strength and sensation are intact Psych: euthymic mood, full affect   EKG:   The ekgs ordered in the past  show normal sinus rhythm, no ST segment changes   Recent Labs: 05/17/2016: BUN 15; Creatinine, Ser 1.00; Hemoglobin 14.6; Platelets 213; Potassium 3.5; Sodium 136   Lipid Panel    Component Value Date/Time   CHOL 203 (H) 03/01/2016 1141   TRIG 100 03/01/2016 1141   HDL 52 03/01/2016 1141   CHOLHDL 3.9 03/01/2016 1141   VLDL 20 03/01/2016 1141   LDLCALC 131 (H) 03/01/2016 1141     Other  studies Reviewed: Additional studies/ records that were reviewed today with results demonstrating: Hospital records reviewed.   ASSESSMENT AND PLAN:  1.  chest discomfort: Several atypical features of angina. Given that his symptoms have lasted for months and showed no signs of bone away, I don't think a stress test will be helpful. Even a negative stress test result along with persistent typical anginal symptoms would likely result in an angiogram. The risks and benefits of cardiac catheterization were explained to the patient and he is willing to proceed. All questions were answered.  Start aspirin daily which she has been taking intermittently.  Start isosorbide 30 mg daily. We'll also give a prescription for sublingual nitroglycerin. 2. LDL slightly above target. Long-term, would encourage exercise and dietary changes to help bring down his cholesterol.    Current medicines are reviewed at length with the patient today.  The patient concerns regarding his medicines were addressed.  The following changes have been made:  No change  Labs/ tests ordered today include:  No orders of the defined types were placed in this encounter.   Recommend 150 minutes/week of aerobic exercise Low fat, low carb, high fiber diet recommended  Disposition:   FU for cath   Signed, Larae Grooms, MD  05/18/2016 11:49 AM    [facility] [address], [place], [place]  [address] Phone: [phone]; Fax: [phone]

## 2016-05-18 NOTE — Patient Instructions (Signed)
Medication Instructions:  START isosorbide mononitrate (imdur) 30 mg daily.  START nitroglycerin sublingual 0.4 mg tablets every 5 minutes up to 3 doses AS NEEDED for chest pain.  Labwork: LABS TODAY: CBC, BMET, PT/INR  Testing/Procedures: Your physician has requested that you have a cardiac catheterization. Cardiac catheterization is used to diagnose and/or treat various heart conditions. Doctors may recommend this procedure for a number of different reasons. The most common reason is to evaluate chest pain. Chest pain can be a symptom of coronary artery disease (CAD), and cardiac catheterization can show whether plaque is narrowing or blocking your heart's arteries. This procedure is also used to evaluate the valves, as well as measure the blood flow and oxygen levels in different parts of your heart. For further information please visit [email or web address].[email or web address]. Please follow instruction sheet, as given.   Follow-Up: To be determined after cardiac catheterization.  Any Other Special Instructions Will Be Listed Below (If Applicable).   Angiografa coronaria (Coronary Angiogram) Una angiografa coronaria, tambin denominada angiograma coronario, es una radiografa que se [place] para observar las arterias del corazn. En este procedimiento se inyecta un material de contraste a travs de un tubo largo y [place] (catter). El catter tiene [employer] de un espagueti cocido y se inserta a travs de la ingle, la [place] o el brazo. La sustancia de contraste se inyecta en cada arteria y se toman radiografas para ver si hay una obstruccin en las arterias cardacas. INFORME A SU MDICO:  [employer] tenga, incluidas alergias a Occupational hygienist o a sustancias de [place].  Todos los [employer], incluidos vitaminas, hierbas, gotas oftlmicas, cremas y medicamentos de venta libre.  Problemas previos que usted o los [employer] de su familia hayan tenido con el uso de  anestsicos.  Enfermedades de la sangre que tenga.  Cirugas previas.  Antecedentes de problemas renales o insuficiencia renal.  Otras enfermedades que tenga. RIESGOS Y COMPLICACIONES  En general, la angiografa coronaria es un procedimiento seguro. Sin embargo, pueden ocurrir [employer], por ejemplo:  Risk analyst a la sustancia de Programmer, multimedia.  Hemorragia en el lugar de acceso u otras zonas.  Lesin renal, especialmente en las personas con trastornos en la funcin renal.  Ictus (poco frecuente).  Ataque cardaco (poco frecuente). ANTES DEL PROCEDIMIENTO   No coma ni beba nada despus de la medianoche anterior al procedimiento, o segn le haya indicado su mdico.  Consulte a su mdico si debe cambiar o suspender los medicamentos que toma habitualmente. Esto es muy importante si toma medicamentos para la diabetes o anticoagulantes. PROCEDIMIENTO  Podrn administrarle una medicacin que lo ayudar a relajarse (sedante) antes del procedimiento. Se la administrarn a travs de un catter por va intravenosa (IV) que se inserta en una de las venas.  La zona en la que se insertar el catter se lava y se rasura. Generalmente se inserta en la ingle, pero puede insertarse en el pliegue del brazo (cerca del codo) o en la [place].  Le aplicarn un medicamento para adormecer la zona en la que se insertar el catter (anestesia local).  El mdico insertar el catter en una arteria. El catter se guiar utilizando un tipo especial de radiografa (fluoroscopia) del vaso sanguneo examinado.  Luego se inyectar una sustancia de contraste especial por el catter y se tomarn radiografas. La sustancia de contraste mostrar si hay estrechamientos u obstrucciones en las arterias cardacas. DESPUS DEL PROCEDIMIENTO   Si el procedimiento se realiza a travs de la pierna, deber [employer]  en la cama durante varias horas. Le indicarn que no flexione ni cruce las piernas.  El  lugar de la insercin ser controlado con frecuencia.  Le controlarn con frecuencia el pulso en el pie o en la [place].  Le harn anlisis de [employer], le tomarn radiografas y Publishing rights manager. Esta informacin no tiene Marine scientist el consejo del mdico. Asegrese de hacerle al mdico cualquier pregunta que tenga. Document Released: 12/07/2004 Document Revised: 03/20/2014 Elsevier Interactive Patient Education  2017 [employer].    If you need a refill on your cardiac medications before your next appointment, please call your pharmacy.

## 2016-05-19 LAB — BASIC METABOLIC PANEL
BUN / CREAT RATIO: 20 (ref 9–20)
BUN: 20 mg/dL (ref 6–24)
CO2: 29 mmol/L (ref 18–29)
CREATININE: 1.01 mg/dL (ref 0.76–1.27)
Calcium: 10 mg/dL (ref 8.7–10.2)
Chloride: 100 mmol/L (ref 96–106)
GFR calc non Af Amer: 89 mL/min/{1.73_m2} (ref >59)
GFR, EST AFRICAN AMERICAN: 103 mL/min/{1.73_m2} (ref >59)
GLUCOSE: 60 mg/dL — AB (ref 65–99)
Potassium: 3.9 mmol/L (ref 3.5–5.2)
SODIUM: 142 mmol/L (ref 134–144)

## 2016-05-19 LAB — PROTIME-INR
INR: 1 (ref 0.8–1.2)
Prothrombin Time: 10.9 s (ref 9.1–12.0)

## 2016-05-19 LAB — CBC
Hematocrit: 44.4 % (ref 37.5–51.0)
Hemoglobin: 15.2 g/dL (ref 13.0–17.7)
MCH: 30.9 pg (ref 26.6–33.0)
MCHC: 34.2 g/dL (ref 31.5–35.7)
MCV: 90 fL (ref 79–97)
PLATELETS: 241 10*3/uL (ref 150–379)
RBC: 4.92 x10E6/uL (ref 4.14–5.80)
RDW: 12.7 % (ref 12.3–15.4)
WBC: 4.9 10*3/uL (ref 3.4–10.8)

## 2016-05-22 ENCOUNTER — Encounter (HOSPITAL_COMMUNITY)
Admission: RE | Disposition: A | Discharge status: Home / Self Care | Payer: Self-pay | Source: Ambulatory Visit | Attending: Interventional Cardiology

## 2016-05-22 ENCOUNTER — Ambulatory Visit (HOSPITAL_COMMUNITY)
Admission: RE | Admit: 2016-05-22 | Discharge: 2016-05-22 | Disposition: A | Discharge status: Home / Self Care | Payer: Medicaid Other | Source: Ambulatory Visit | Attending: Interventional Cardiology | Admitting: Interventional Cardiology

## 2016-05-22 DIAGNOSIS — M5442 Lumbago with sciatica, left side: Secondary | ICD-10-CM | POA: Insufficient documentation

## 2016-05-22 DIAGNOSIS — Z7951 Long term (current) use of inhaled steroids: Secondary | ICD-10-CM | POA: Insufficient documentation

## 2016-05-22 DIAGNOSIS — R072 Precordial pain: Secondary | ICD-10-CM | POA: Diagnosis not present

## 2016-05-22 DIAGNOSIS — M5441 Lumbago with sciatica, right side: Secondary | ICD-10-CM | POA: Diagnosis not present

## 2016-05-22 DIAGNOSIS — F419 Anxiety disorder, unspecified: Secondary | ICD-10-CM | POA: Diagnosis not present

## 2016-05-22 HISTORY — PX: LEFT HEART CATH AND CORONARY ANGIOGRAPHY: CATH118249

## 2016-05-22 SURGERY — LEFT HEART CATH AND CORONARY ANGIOGRAPHY
Anesthesia: LOCAL

## 2016-05-22 MED ORDER — HEPARIN (PORCINE) IN NACL 2-0.9 UNIT/ML-% IJ SOLN
INTRAMUSCULAR | Status: DC | PRN
  Administered 2016-05-22: 1000 mL

## 2016-05-22 MED ORDER — FENTANYL CITRATE (PF) 100 MCG/2ML IJ SOLN
INTRAMUSCULAR | Status: DC | PRN
  Administered 2016-05-22: 25 ug via INTRAVENOUS

## 2016-05-22 MED ORDER — HEPARIN (PORCINE) IN NACL 2-0.9 UNIT/ML-% IJ SOLN
INTRAMUSCULAR | Status: AC
  Filled 2016-05-22: qty 1000

## 2016-05-22 MED ORDER — MIDAZOLAM HCL 2 MG/2ML IJ SOLN
INTRAMUSCULAR | Status: AC
  Filled 2016-05-22: qty 2

## 2016-05-22 MED ORDER — VERAPAMIL HCL 2.5 MG/ML IV SOLN
INTRAVENOUS | Status: AC
  Filled 2016-05-22: qty 2

## 2016-05-22 MED ORDER — HEPARIN SODIUM (PORCINE) 1000 UNIT/ML IJ SOLN
INTRAMUSCULAR | Status: AC
  Filled 2016-05-22: qty 1

## 2016-05-22 MED ORDER — HEPARIN SODIUM (PORCINE) 1000 UNIT/ML IJ SOLN
INTRAMUSCULAR | Status: DC | PRN
  Administered 2016-05-22: 3500 [IU] via INTRAVENOUS

## 2016-05-22 MED ORDER — SODIUM CHLORIDE 0.9% FLUSH
3.0000 mL | Freq: Two times a day (BID) | INTRAVENOUS | Status: DC
Start: 2016-05-22 — End: 2016-05-22

## 2016-05-22 MED ORDER — ASPIRIN 81 MG PO CHEW
CHEWABLE_TABLET | ORAL | Status: AC
  Filled 2016-05-22: qty 1

## 2016-05-22 MED ORDER — IOPAMIDOL (ISOVUE-370) INJECTION 76%
INTRAVENOUS | Status: AC
  Filled 2016-05-22: qty 100

## 2016-05-22 MED ORDER — SODIUM CHLORIDE 0.9% FLUSH: 3.0000 mL | INTRAVENOUS | Status: DC | PRN

## 2016-05-22 MED ORDER — MIDAZOLAM HCL 2 MG/2ML IJ SOLN
INTRAMUSCULAR | Status: DC | PRN
  Administered 2016-05-22: 2 mg via INTRAVENOUS

## 2016-05-22 MED ORDER — SODIUM CHLORIDE 0.9 % WEIGHT BASED INFUSION: 1.0000 mL/kg/h | INTRAVENOUS | Status: DC

## 2016-05-22 MED ORDER — FENTANYL CITRATE (PF) 100 MCG/2ML IJ SOLN
INTRAMUSCULAR | Status: AC
  Filled 2016-05-22: qty 2

## 2016-05-22 MED ORDER — SODIUM CHLORIDE 0.9 % IV SOLN: 250.0000 mL | INTRAVENOUS | Status: DC | PRN

## 2016-05-22 MED ORDER — VERAPAMIL HCL 2.5 MG/ML IV SOLN
INTRAVENOUS | Status: DC | PRN
  Administered 2016-05-22: 12:00:00 via INTRA_ARTERIAL

## 2016-05-22 MED ORDER — SODIUM CHLORIDE 0.9% FLUSH: 3.0000 mL | Freq: Two times a day (BID) | INTRAVENOUS | Status: DC

## 2016-05-22 MED ORDER — ASPIRIN 81 MG PO CHEW
81.0000 mg | CHEWABLE_TABLET | ORAL | Status: AC
  Administered 2016-05-22: 81 mg via ORAL

## 2016-05-22 MED ORDER — LIDOCAINE HCL (PF) 1 % IJ SOLN
INTRAMUSCULAR | Status: DC | PRN
  Administered 2016-05-22: 2 mL

## 2016-05-22 MED ORDER — SODIUM CHLORIDE 0.9 % IV SOLN: INTRAVENOUS | Status: AC

## 2016-05-22 MED ORDER — LIDOCAINE HCL (PF) 1 % IJ SOLN
INTRAMUSCULAR | Status: AC
  Filled 2016-05-22: qty 30

## 2016-05-22 MED ORDER — IOPAMIDOL (ISOVUE-370) INJECTION 76%
INTRAVENOUS | Status: DC | PRN
  Administered 2016-05-22: 30 mL via INTRA_ARTERIAL

## 2016-05-22 MED ORDER — SODIUM CHLORIDE 0.9 % WEIGHT BASED INFUSION
3.0000 mL/kg/h | INTRAVENOUS | Status: AC
  Administered 2016-05-22: 3 mL/kg/h via INTRAVENOUS

## 2016-05-22 SURGICAL SUPPLY — 9 items
CATH 5FR JL3.5 JR4 ANG PIG MP (CATHETERS) ×2 IMPLANT
DEVICE RAD COMP TR BAND LRG (VASCULAR PRODUCTS) ×2 IMPLANT
GLIDESHEATH SLEND SS 6F .021 (SHEATH) ×2 IMPLANT
GUIDEWIRE INQWIRE 1.5J.035X260 (WIRE) ×1 IMPLANT
INQWIRE 1.5J .035X260CM (WIRE) ×2
KIT HEART LEFT (KITS) ×2 IMPLANT
PACK CARDIAC CATHETERIZATION (CUSTOM PROCEDURE TRAY) ×2 IMPLANT
TRANSDUCER W/STOPCOCK (MISCELLANEOUS) ×2 IMPLANT
TUBING CIL FLEX 10 FLL-RA (TUBING) ×2 IMPLANT

## 2016-05-22 NOTE — H&P (View-Only) (Signed)
Cardiology Office Note   Date:  05/18/2016   ID:  Irl Bodie, DOB 1971/02/21, MRN [id number]  PCP:  Fredia Beets, FNP    No chief complaint on file. chest discomfort   Wt Readings from Last 3 Encounters:  05/18/16 147 lb 6.4 oz (66.9 kg)  05/17/16 153 lb (69.4 kg)  04/26/16 154 lb (69.9 kg)       History of Present Illness: Derek Smith is a 46 y.o. male  with no significant prior cardiac history. Over the last few months, he has noticed that with any exertion, he has a pressure in his chest. It goes away with rest. This has made his job difficult. Household chores of also become more difficult. Yesterday, symptoms got worse and he went to the emergency room. He had a negative workup. Troponin was negative along with EKG. He was sent home. He comes in today for further evaluation.  His wife reports that even small activities are making him very short of breath and getting a chest discomfort. He reports seeing yellow spots at time intermittently. He has some dizziness. He is very anxious as well. He has never had any other heart testing done. No associated sweating, lightheadedness or syncope. He has had some arm pain.  Medical history is obtained with the help of a Spanish interpreter.  Past Medical History:  Diagnosis Date  . Anxiety   . 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 Syncope 03/28/2016    Past Surgical History:  Procedure Laterality Date  . NO PAST SURGERIES       Current Outpatient Prescriptions  Medication Sig Dispense Refill  . albuterol (PROVENTIL HFA;VENTOLIN HFA) 108 (90 Base) MCG/ACT inhaler Inhale 2 puffs into the lungs every 4 (four) hours as needed for wheezing or shortness of breath. 1 Inhaler 2  . clonazePAM (KLONOPIN) 0.5 MG tablet Take 1 tablet (0.5 mg total) by mouth 2 (two) times daily as needed for anxiety. 20 tablet 1  . escitalopram (LEXAPRO) 10 MG tablet Take 1 tablet (10 mg total) by mouth daily. 30 tablet 1  .  Fluticasone Propionate, Inhal, (FLOVENT DISKUS) 250 MCG/BLIST AEPB Inhale 1 Inhaler into the lungs 2 (two) times daily. 30 each 2  . omeprazole (PRILOSEC) 20 MG capsule TAKE 1 CAPSULE BY MOUTH DAILY. 30 capsule 0  . predniSONE (STERAPRED UNI-PAK 21 TAB) 10 MG (21) TBPK tablet Take 1 tablet (10 mg total) by mouth daily. 21 tablet 0   No current facility-administered medications for this visit.     Allergies:   Patient has no known allergies.    Social History:  The patient  reports that he has never smoked. He has never used smokeless tobacco. He reports that he does not drink alcohol.   Family History:  The patient's family history includes GI problems in his mother. Mother had some type of heart problem in her 6s   ROS:  Please see the history of present illness.   Otherwise, review of systems are positive for chest discomfort.   All other systems are reviewed and negative.    PHYSICAL EXAM: VS:  BP 108/64   Pulse 71   Ht 5\' 8"  (1.727 m)   Wt 147 lb 6.4 oz (66.9 kg)   SpO2 98%   BMI 22.41 kg/m  , BMI Body mass index is 22.41 kg/m. GEN: Well nourished, well developed, in no acute distress  HEENT: normal  Neck: no JVD, carotid bruits, or masses Cardiac: RRR; no  murmurs, rubs, or gallops,no edema  Respiratory:  clear to auscultation bilaterally, normal work of breathing GI: soft, nontender, nondistended, + BS MS: no deformity or atrophy  Skin: warm and dry, no rash Neuro:  Strength and sensation are intact Psych: euthymic mood, full affect   EKG:   The ekgs ordered in the past  show normal sinus rhythm, no ST segment changes   Recent Labs: 05/17/2016: BUN 15; Creatinine, Ser 1.00; Hemoglobin 14.6; Platelets 213; Potassium 3.5; Sodium 136   Lipid Panel    Component Value Date/Time   CHOL 203 (H) 03/01/2016 1141   TRIG 100 03/01/2016 1141   HDL 52 03/01/2016 1141   CHOLHDL 3.9 03/01/2016 1141   VLDL 20 03/01/2016 1141   LDLCALC 131 (H) 03/01/2016 1141     Other  studies Reviewed: Additional studies/ records that were reviewed today with results demonstrating: Hospital records reviewed.   ASSESSMENT AND PLAN:  1.  chest discomfort: Several atypical features of angina. Given that his symptoms have lasted for months and showed no signs of bone away, I don't think a stress test will be helpful. Even a negative stress test result along with persistent typical anginal symptoms would likely result in an angiogram. The risks and benefits of cardiac catheterization were explained to the patient and he is willing to proceed. All questions were answered.  Start aspirin daily which she has been taking intermittently.  Start isosorbide 30 mg daily. We'll also give a prescription for sublingual nitroglycerin. 2. LDL slightly above target. Long-term, would encourage exercise and dietary changes to help bring down his cholesterol.    Current medicines are reviewed at length with the patient today.  The patient concerns regarding his medicines were addressed.  The following changes have been made:  No change  Labs/ tests ordered today include:  No orders of the defined types were placed in this encounter.   Recommend 150 minutes/week of aerobic exercise Low fat, low carb, high fiber diet recommended  Disposition:   FU for cath   Signed, Larae Grooms, MD  05/18/2016 11:49 AM    [facility] [address], [place], [place]  [address] Phone: [phone]; Fax: [phone]

## 2016-05-22 NOTE — Discharge Instructions (Signed)
He may return to work on Monday 05/29/16, without restriction.  Cuidado del sitio del radio [facility]) Siga estas instrucciones durante las prximas semanas. Estas indicaciones le proporcionan informacin acerca de cmo deber cuidarse despus del procedimiento. El mdico tambin podr darle instrucciones ms especficas. El tratamiento ha sido planificado segn las prcticas mdicas actuales, pero en algunos casos pueden ocurrir problemas. Comunquese con el mdico si tiene algn problema o tiene dudas despus del procedimiento. QU ESPERAR DESPUS DEL PROCEDIMIENTO Despus del procedimiento, es normal tener lo siguiente:  Hematomas en el sitio del radio que suelen desaparecer en el trmino de 1 o 2semanas.  Acumulacin de sangre en el tejido (hematoma) que puede ser dolorosa al tacto. Generalmente, en 1 o 2 semanas deben disminuir su tamao y el dolor que produce con la palpacin. [place] los medicamentos solamente como se lo haya indicado el mdico.  Puede ducharse 24a 48horas despus del procedimiento o como se lo haya indicado el mdico. Retire el vendaje (apsito) y limpie suavemente el lugar de la insercin con agua y jabn comn. Seque bien el rea con una toalla limpia dando golpecitos. No frote el lugar, ya que Therapist, art.  No tome baos de inmersin, no nade ni use el jacuzzi hasta que el mdico lo autorice.  Revise diariamente el lugar de la insercin para ver si aparece enrojecimiento, hinchazn o secrecin.  No se aplique talcos ni lociones en el lugar.  No flexione ni doble el brazo afectado durante 24horas o como se lo haya indicado el mdico.  No haga esfuerzos ni levante objetos pesados con el brazo afectado durante 24horas o como se lo haya indicado el mdico.  No levante objetos que pesen ms de 10libras (4,5kg) durante los 5das posteriores al procedimiento o como se lo haya indicado el mdico.  Pregntele al  mdico cundo puede hacer lo siguiente:  Regresar a la escuela o al [employer].  Reanudar las actividades fsicas o los deportes que practica habitualmente.  Reanudar la actividad sexual.  No conduzca el automvil por sus propios medios si le dan el alta el mismo da del procedimiento. Pdale a otra persona que lo lleve.  Puede conducir 24horas despus del procedimiento, a menos que el mdico le haya indicado lo contrario.  No opere maquinaria ni herramientas elctricas durante 24horas despus del procedimiento.  Si el procedimiento se hizo de [employer], lo que significa que regres a su casa el mismo da de su realizacin, un adulto responsable debe acompaarlo durante las primeras 24horas.  Concurra a todas las visitas de control como se lo haya indicado el mdico. Esto es importante. SOLICITE ATENCIN MDICA SI:  Jaclynn Guarneri.  Tiene escalofros.  Aumenta el sangrado en el sitio del radio. Haga presin Mudlogger. SOLICITE ATENCIN MDICA DE INMEDIATO SI:  Tiene un dolor que no es habitual en el sitio del radio.  Nota que el sitio del radio est enrojecido, caliente o hinchado.  Tiene secrecin (que no es una pequea cantidad de sangre en el vendaje) en el sitio del radio.  El sitio del radio est sangrando, y el sangrado no se detiene despus de 62minutos de Oceanographer una presin constante.  El brazo o la mano se le ponen plidos, fros o siente hormigueo o adormecimiento. Esta informacin no tiene Marine scientist el consejo del mdico. Asegrese de hacerle al mdico cualquier pregunta que tenga. Document Released: 06/24/2010 Document Revised: 03/20/2014 Document Reviewed: 09/15/2013 Elsevier Interactive Patient  Education  2017 [employer].

## 2016-05-22 NOTE — Interval H&P Note (Signed)
Cath Lab Visit (complete for each Cath Lab visit)  Clinical Evaluation Leading to the Procedure:   ACS: No.  Non-ACS:    Anginal Classification: CCS III  Anti-ischemic medical therapy: Minimal Therapy (1 class of medications)  Non-Invasive Test Results: No non-invasive testing performed  Prior CABG: No previous CABG    Accelerating angina  History and Physical Interval Note:  05/22/2016 11:50 AM  Derek Smith  has presented today for surgery, with the diagnosis of angina  The various methods of treatment have been discussed with the patient and family. After consideration of risks, benefits and other options for treatment, the patient has consented to  Procedure(s): Left Heart Cath and Coronary Angiography (N/A) as a surgical intervention .  The patient's history has been reviewed, patient examined, no change in status, stable for surgery.  I have reviewed the patient's chart and labs.  Questions were answered to the patient's satisfaction.     Larae Grooms

## 2016-05-23 ENCOUNTER — Encounter (HOSPITAL_COMMUNITY): Payer: Self-pay | Admitting: Interventional Cardiology

## 2016-10-04 ENCOUNTER — Ambulatory Visit (INDEPENDENT_AMBULATORY_CARE_PROVIDER_SITE_OTHER): Payer: Medicaid Other | Admitting: Physician Assistant

## 2016-10-04 ENCOUNTER — Encounter (INDEPENDENT_AMBULATORY_CARE_PROVIDER_SITE_OTHER): Payer: Self-pay | Admitting: Physician Assistant

## 2016-10-04 VITALS — BP 114/75 | HR 65 | Temp 98.2°F | Ht 67.0 in | Wt 152.8 lb

## 2016-10-04 DIAGNOSIS — R1013 Epigastric pain: Secondary | ICD-10-CM | POA: Diagnosis not present

## 2016-10-04 DIAGNOSIS — M5441 Lumbago with sciatica, right side: Secondary | ICD-10-CM | POA: Diagnosis not present

## 2016-10-04 DIAGNOSIS — M5442 Lumbago with sciatica, left side: Secondary | ICD-10-CM

## 2016-10-04 DIAGNOSIS — F411 Generalized anxiety disorder: Secondary | ICD-10-CM

## 2016-10-04 DIAGNOSIS — G8929 Other chronic pain: Secondary | ICD-10-CM

## 2016-10-04 MED ORDER — ESCITALOPRAM OXALATE 20 MG PO TABS: 20.0000 mg | ORAL_TABLET | Freq: Every day | ORAL | 1 refills | Status: DC

## 2016-10-04 MED ORDER — CLONAZEPAM 0.5 MG PO TABS: 0.5000 mg | ORAL_TABLET | Freq: Every day | ORAL | 0 refills | Status: DC

## 2016-10-04 MED ORDER — GABAPENTIN 300 MG PO CAPS: 300.0000 mg | ORAL_CAPSULE | Freq: Three times a day (TID) | ORAL | 3 refills | Status: DC

## 2016-10-04 MED ORDER — OMEPRAZOLE 40 MG PO CPDR: 40.0000 mg | DELAYED_RELEASE_CAPSULE | Freq: Every day | ORAL | 1 refills | Status: DC

## 2016-10-04 NOTE — Progress Notes (Signed)
Subjective:  Patient ID: Derek Smith, male    DOB: 1970-12-27  Age: 46 y.o. MRN: [id number]  CC: back pain radiculopathy  HPI Derek Smith is a 46 y.o. male with a PMH of anxiety presents on f/u of anxiety and LBP. Was previously prescribed Clonazepam 0.5 mg and escitalopram 10mg  in 04/2016 and reports doing better in regards to anxiety. Has since run out of medication. Wife describes anxiety attacks since he ran out of medications. Has chest pain with anxiety attacks but has had left heart cath ordered by cardiology on 05/22/16. Heart cath was normal.     Bilateral LBP with left sided sciatica continues to bother him. Has taken prednisone and tylenol with temporary relief of symptoms. He has since run out and wife has given him some of her Gabapentin which helps with his pain. Worse with activities, lifting, and flexion/extension/rotation/lateral flexion.    Has epigastric pain on occasion. Describes pain as a severe stabbing sensation. Pain does not radiate. Believes pain is associated with ingestion of food or liquid. Has nausea with vomiting and abdominal distention at times. Denies melena, hematochezia, fever, or chills.    Outpatient Medications Prior to Visit  Medication Sig Dispense Refill  . omeprazole (PRILOSEC) 20 MG capsule TAKE 1 CAPSULE BY MOUTH DAILY. 30 capsule 0  . acetaminophen (TYLENOL) 500 MG tablet Take 1,000 mg by mouth daily as needed for moderate pain or headache.    . albuterol (PROVENTIL HFA;VENTOLIN HFA) 108 (90 Base) MCG/ACT inhaler Inhale 2 puffs into the lungs every 4 (four) hours as needed for wheezing or shortness of breath. (Patient not taking: Reported on 10/04/2016) 1 Inhaler 2  . aspirin EC 81 MG tablet Take 81 mg by mouth every evening.    . clonazePAM (KLONOPIN) 0.5 MG tablet Take 1 tablet (0.5 mg total) by mouth 2 (two) times daily as needed for anxiety. (Patient not taking: Reported on 10/04/2016) 20 tablet 1  . escitalopram (LEXAPRO) 10 MG tablet  Take 1 tablet (10 mg total) by mouth daily. (Patient not taking: Reported on 10/04/2016) 30 tablet 1  . Fluticasone Propionate, Inhal, (FLOVENT DISKUS) 250 MCG/BLIST AEPB Inhale 1 Inhaler into the lungs 2 (two) times daily. (Patient not taking: Reported on 10/04/2016) 30 each 2  . nitroGLYCERIN (NITROSTAT) 0.4 MG SL tablet Place 1 tablet (0.4 mg total) under the tongue every 5 (five) minutes as needed for chest pain. 90 tablet 3  . ranitidine (ZANTAC) 150 MG tablet Take 150 mg by mouth daily as needed for heartburn.     No facility-administered medications prior to visit.      ROS Review of Systems  Constitutional: Negative for chills, fever and malaise/fatigue.  Eyes: Negative for blurred vision.  Respiratory: Negative for shortness of breath.   Cardiovascular: Negative for chest pain and palpitations.  Gastrointestinal: Negative for abdominal pain and nausea.  Genitourinary: Negative for dysuria and hematuria.  Musculoskeletal: Negative for joint pain and myalgias.  Skin: Negative for rash.  Neurological: Negative for tingling and headaches.  Psychiatric/Behavioral: Negative for depression. The patient is nervous/anxious.     Objective:  BP 114/75 (BP Location: Right Arm, Patient Position: Sitting, Cuff Size: Normal)   Pulse 65   Temp 98.2 F (36.8 C) (Oral)   Ht 5\' 7"  (1.702 m)   Wt 152 lb 12.8 oz (69.3 kg)   SpO2 97%   BMI 23.93 kg/m   BP/Weight 10/04/2016 05/11/6008 11/13/2353  Systolic BP 732 202 542  Diastolic BP 75 66 64  Wt. (  Lbs) 152.8 140 147.4  BMI 23.93 21.29 22.41      Physical Exam  Constitutional: He is oriented to person, place, and time.  Well developed, well nourished, NAD, polite  HENT:  Head: Normocephalic and atraumatic.  Eyes: No scleral icterus.  Neck: Normal range of motion. Neck supple. No thyromegaly present.  Cardiovascular: Normal rate, regular rhythm and normal heart sounds.   Pulmonary/Chest: Effort normal and breath sounds normal.   Musculoskeletal: He exhibits no edema.  Neurological: He is alert and oriented to person, place, and time. No cranial nerve deficit. Coordination normal.  Skin: Skin is warm and dry. No rash noted. No erythema. No pallor.  Psychiatric: He has a normal mood and affect. His behavior is normal. Thought content normal.  Vitals reviewed.    Assessment & Plan:   1. Chronic left-sided low back pain with bilateral sciatica - DG THORACOLUMABAR SPINE; Future  2. Anxiety state - Begin clonazePAM (KLONOPIN) 0.5 MG tablet; Take 1 tablet (0.5 mg total) by mouth at bedtime.  Dispense: 30 tablet; Refill: 0 - Begin Escitalopram 20 mg qday  3. Abdominal pain, chronic, epigastric - H. pylori antibody, IgG - CBC with Differential - Comprehensive metabolic panel - Lipase   Meds ordered this encounter  Medications  . clonazePAM (KLONOPIN) 0.5 MG tablet    Sig: Take 1 tablet (0.5 mg total) by mouth at bedtime.    Dispense:  30 tablet    Refill:  0    Order Specific Question:   Supervising Provider    Answer:   Tresa Garter [id number]  . DISCONTD: escitalopram (LEXAPRO) 20 MG tablet    Sig: Take 1 tablet (20 mg total) by mouth daily.    Dispense:  90 tablet    Refill:  1    Order Specific Question:   Supervising Provider    Answer:   Tresa Garter [id number]  . DISCONTD: gabapentin (NEURONTIN) 300 MG capsule    Sig: Take 1 capsule (300 mg total) by mouth 3 (three) times daily.    Dispense:  90 capsule    Refill:  3    Order Specific Question:   Supervising Provider    Answer:   Tresa Garter [id number]  . DISCONTD: omeprazole (PRILOSEC) 40 MG capsule    Sig: Take 1 capsule (40 mg total) by mouth daily.    Dispense:  30 capsule    Refill:  1    Order Specific Question:   Supervising Provider    Answer:   Tresa Garter [id number]  . escitalopram (LEXAPRO) 20 MG tablet    Sig: Take 1 tablet (20 mg total) by mouth daily.    Dispense:  90 tablet    Refill:  1     Order Specific Question:   Supervising Provider    Answer:   Tresa Garter [id number]  . gabapentin (NEURONTIN) 300 MG capsule    Sig: Take 1 capsule (300 mg total) by mouth 3 (three) times daily.    Dispense:  90 capsule    Refill:  3    Order Specific Question:   Supervising Provider    Answer:   Tresa Garter [id number]  . omeprazole (PRILOSEC) 40 MG capsule    Sig: Take 1 capsule (40 mg total) by mouth daily.    Dispense:  30 capsule    Refill:  1    Order Specific Question:   Supervising Provider    Answer:   Tresa Garter [id number]  Follow-up: Return in about 4 weeks (around 11/01/2016) for anxiety.   Clent Demark PA

## 2016-10-04 NOTE — Patient Instructions (Signed)
   Trastorno de ansiedad generalizada (Generalized Anxiety Disorder) El trastorno de ansiedad generalizada es un trastorno mental. Interfiere en las funciones vitales, incluyendo las relaciones, el trabajo y la escuela.  Es diferente de la ansiedad normal que todas las personas experimentan en algn momento de su vida en respuesta a sucesos y actividades especficas. En verdad, la ansiedad normal nos ayuda a prepararnos y atravesar estos acontecimientos y actividades de la vida. La ansiedad normal desaparece despus de que el evento o la actividad ha finalizado.  El trastorno de ansiedad generalizada no est necesariamente relacionada con eventos o actividades especficas. Tambin causa un exceso de ansiedad en proporcin a sucesos o actividades especficas. En este trastorno la ansiedad es difcil de controlar. Los sntomas pueden variar de leves a muy graves. Las personas que sufren de trastorno de ansiedad generalizada pueden tener intensas olas de ansiedad con sntomas fsicos (ataques de pnico).  SNTOMAS  La ansiedad y la preocupacin asociada a este trastorno son difciles de controlar. Esta ansiedad y la preocupacin estn relacionados con muchos eventos de la vida y sus actividades y tambin ocurre durante ms das de los que no ocurre, durante 6 meses o ms. Las personas que la sufren pueden tener tres o ms de los siguientes sntomas (uno o ms en los nios):   Agitacin   Fatiga.  Dificultades de concentracin.   Irritabilidad.  Tensin muscular  Dificultad para dormirse o sueo poco satisfactorio. DIAGNSTICO  Se diagnostica a travs de una evaluacin realizada por el mdico. El mdico le har preguntas acerca de su estado de nimo, sntomas fsicos y sucesos de su vida. Le har preguntas sobre su historia clnica, el consumo de alcohol o drogas, incluyendo los medicamentos recetados. Tambin le har un examen fsico e indicar anlisis de sangre. Ciertas enfermedades y el uso de  determinadas sustancias pueden causar sntomas similares a este trastorno. Su mdico lo puede derivar a un especialista en salud mental para una evaluacin ms profunda..  TRATAMIENTO  Las terapias siguientes se utilizan en el tratamiento de este trastorno:   Medicamentos - Se recetan antidepresivos para el control diario a largo plazo. Pueden indicarse tambin medicamentos para combatir la ansiedad en los casos graves, especialmente cuando ocurren ataques de pnico.   Terapia conversada (psicoterapia) Ciertos tipos de psicoterapia pueden ser tiles en el tratamiento del trastorno de ansiedad generalizada, proporcionando apoyo, educacin y orientacin. Una forma de psicoterapia llamada terapia cognitivo-conductual puede ensearle formas saludables de pensar y reaccionar a los eventos y actividades de la vida diaria.  Tcnicasde manejo del estrs- Estas tcnicas incluyen el yoga, la meditacin y el ejercicio y pueden ser muy tiles cuando se practican con regularidad. Un especialista en salud mental puede ayudar a determinar qu tratamiento es mejor para usted. Algunas personas obtienen mejora con una terapia. Sin embargo, otras personas requieren una combinacin de terapias.  Esta informacin no tiene como fin reemplazar el consejo del mdico. Asegrese de hacerle al mdico cualquier pregunta que tenga. Document Released: 06/24/2012 Document Revised: 03/20/2014 Elsevier Interactive Patient Education  2017 Elsevier Inc.  

## 2016-10-05 ENCOUNTER — Other Ambulatory Visit (INDEPENDENT_AMBULATORY_CARE_PROVIDER_SITE_OTHER): Payer: Self-pay | Admitting: Physician Assistant

## 2016-10-05 ENCOUNTER — Ambulatory Visit (HOSPITAL_COMMUNITY)
Admission: RE | Admit: 2016-10-05 | Discharge: 2016-10-05 | Disposition: A | Discharge status: Home / Self Care | Payer: Medicaid Other | Source: Ambulatory Visit | Attending: Physician Assistant | Admitting: Physician Assistant

## 2016-10-05 ENCOUNTER — Encounter (HOSPITAL_COMMUNITY): Payer: Self-pay

## 2016-10-05 DIAGNOSIS — M5441 Lumbago with sciatica, right side: Secondary | ICD-10-CM | POA: Diagnosis present

## 2016-10-05 DIAGNOSIS — M5442 Lumbago with sciatica, left side: Secondary | ICD-10-CM | POA: Diagnosis present

## 2016-10-05 DIAGNOSIS — M419 Scoliosis, unspecified: Secondary | ICD-10-CM | POA: Diagnosis not present

## 2016-10-05 DIAGNOSIS — G8929 Other chronic pain: Secondary | ICD-10-CM | POA: Insufficient documentation

## 2016-10-05 DIAGNOSIS — A048 Other specified bacterial intestinal infections: Secondary | ICD-10-CM

## 2016-10-05 LAB — CBC WITH DIFFERENTIAL/PLATELET
BASOS ABS: 0 10*3/uL (ref 0.0–0.2)
Basos: 1 %
EOS (ABSOLUTE): 0.2 10*3/uL (ref 0.0–0.4)
Eos: 3 %
Hematocrit: 43.3 % (ref 37.5–51.0)
Hemoglobin: 14.5 g/dL (ref 13.0–17.7)
Immature Grans (Abs): 0 10*3/uL (ref 0.0–0.1)
Immature Granulocytes: 1 %
LYMPHS ABS: 2.3 10*3/uL (ref 0.7–3.1)
LYMPHS: 39 %
MCH: 30.4 pg (ref 26.6–33.0)
MCHC: 33.5 g/dL (ref 31.5–35.7)
MCV: 91 fL (ref 79–97)
MONOS ABS: 0.7 10*3/uL (ref 0.1–0.9)
Monocytes: 11 %
NEUTROS ABS: 2.8 10*3/uL (ref 1.4–7.0)
Neutrophils: 45 %
PLATELETS: 260 10*3/uL (ref 150–379)
RBC: 4.77 x10E6/uL (ref 4.14–5.80)
RDW: 12.7 % (ref 12.3–15.4)
WBC: 6 10*3/uL (ref 3.4–10.8)

## 2016-10-05 LAB — COMPREHENSIVE METABOLIC PANEL
A/G RATIO: 1.6 (ref 1.2–2.2)
ALK PHOS: 60 IU/L (ref 39–117)
ALT: 21 IU/L (ref 0–44)
AST: 25 IU/L (ref 0–40)
Albumin: 4.7 g/dL (ref 3.5–5.5)
BILIRUBIN TOTAL: 0.4 mg/dL (ref 0.0–1.2)
BUN/Creatinine Ratio: 16 (ref 9–20)
BUN: 18 mg/dL (ref 6–24)
CHLORIDE: 100 mmol/L (ref 96–106)
CO2: 27 mmol/L (ref 20–29)
Calcium: 9.5 mg/dL (ref 8.7–10.2)
Creatinine, Ser: 1.14 mg/dL (ref 0.76–1.27)
GFR calc Af Amer: 89 mL/min/{1.73_m2} (ref >59)
GFR calc non Af Amer: 77 mL/min/{1.73_m2} (ref >59)
GLUCOSE: 88 mg/dL (ref 65–99)
Globulin, Total: 2.9 g/dL (ref 1.5–4.5)
POTASSIUM: 4.7 mmol/L (ref 3.5–5.2)
Sodium: 142 mmol/L (ref 134–144)
Total Protein: 7.6 g/dL (ref 6.0–8.5)

## 2016-10-05 LAB — H. PYLORI ANTIBODY, IGG: H PYLORI IGG: 1.07 {index_val} — AB (ref 0.00–0.79)

## 2016-10-05 LAB — LIPASE: Lipase: 22 U/L (ref 13–78)

## 2016-10-05 MED ORDER — AMOXICILLIN 500 MG PO TABS: 1000.0000 mg | ORAL_TABLET | Freq: Two times a day (BID) | ORAL | 0 refills | Status: AC

## 2016-10-05 MED ORDER — CLARITHROMYCIN 500 MG PO TABS: 500.0000 mg | ORAL_TABLET | Freq: Two times a day (BID) | ORAL | 0 refills | Status: AC

## 2016-11-01 ENCOUNTER — Encounter (INDEPENDENT_AMBULATORY_CARE_PROVIDER_SITE_OTHER): Payer: Self-pay | Admitting: Physician Assistant

## 2016-11-01 ENCOUNTER — Ambulatory Visit (INDEPENDENT_AMBULATORY_CARE_PROVIDER_SITE_OTHER): Payer: Medicaid Other | Admitting: Physician Assistant

## 2016-11-01 VITALS — BP 108/72 | HR 69 | Temp 98.4°F | Wt 159.8 lb

## 2016-11-01 DIAGNOSIS — T148XXA Other injury of unspecified body region, initial encounter: Secondary | ICD-10-CM

## 2016-11-01 DIAGNOSIS — G629 Polyneuropathy, unspecified: Secondary | ICD-10-CM | POA: Diagnosis not present

## 2016-11-01 DIAGNOSIS — F411 Generalized anxiety disorder: Secondary | ICD-10-CM | POA: Diagnosis not present

## 2016-11-01 DIAGNOSIS — A048 Other specified bacterial intestinal infections: Secondary | ICD-10-CM

## 2016-11-01 MED ORDER — LEVOFLOXACIN 500 MG PO TABS: 500.0000 mg | ORAL_TABLET | Freq: Every day | ORAL | 0 refills | Status: DC

## 2016-11-01 MED ORDER — OMEPRAZOLE 40 MG PO CPDR: 40.0000 mg | DELAYED_RELEASE_CAPSULE | Freq: Every day | ORAL | 3 refills | Status: DC

## 2016-11-01 MED ORDER — CYCLOBENZAPRINE HCL 5 MG PO TABS: 5.0000 mg | ORAL_TABLET | Freq: Every day | ORAL | 0 refills | Status: DC

## 2016-11-01 MED ORDER — KETOROLAC TROMETHAMINE 60 MG/2ML IM SOLN
60.0000 mg | Freq: Once | INTRAMUSCULAR | Status: AC
  Administered 2016-11-01: 60 mg via INTRAMUSCULAR

## 2016-11-01 MED ORDER — ESCITALOPRAM OXALATE 20 MG PO TABS: 20.0000 mg | ORAL_TABLET | Freq: Every day | ORAL | 1 refills | Status: DC

## 2016-11-01 MED ORDER — GABAPENTIN 300 MG PO CAPS: 600.0000 mg | ORAL_CAPSULE | Freq: Three times a day (TID) | ORAL | 3 refills | Status: DC

## 2016-11-01 NOTE — Progress Notes (Signed)
Subjective:  Patient ID: Derek Smith, male    DOB: 06-30-1970  Age: 46 y.o. MRN: [id number]  CC: f/u anxiety  HPI Derek Smith is a 46 y.o. male with a medical history of anxiety, syncope, and left sided LBP with bilateral sciatica. Presents on f/u of H pylori. Has been taking Amoxicillin 500 mg twice a day. Did not receive any other antibiotic. Ran out of Prilosec. Says he is feeling mild to moderately better in regards to epigastric pain.      He is also feeling better in regards to anxiety. Has been taking escitalopram and clonazepam as directed. Sleep has also improved.     Wants to address LBP. Recemt XR of L spine and T spine are negative. However, pt still has persistent LBP that is exacerbated with back flexion. Sometimes feels he get "stuck" when flexing and has to move to an erect posture slowly due to the pain. Feels right sided neuropathy at times that radiates to the foot. He questions if neuropathy is secondary to a disease process. Currently taking gabapentin 300 mg TID with mild relief of neuropathy. He also notes there is weakness of his legs when going up the stairs. Unsure if this is attributed to his employment which is strenuous.        Outpatient Medications Prior to Visit  Medication Sig Dispense Refill  . acetaminophen (TYLENOL) 500 MG tablet Take 1,000 mg by mouth daily as needed for moderate pain or headache.    . albuterol (PROVENTIL HFA;VENTOLIN HFA) 108 (90 Base) MCG/ACT inhaler Inhale 2 puffs into the lungs every 4 (four) hours as needed for wheezing or shortness of breath. (Patient not taking: Reported on 10/04/2016) 1 Inhaler 2  . aspirin EC 81 MG tablet Take 81 mg by mouth every evening.    . clonazePAM (KLONOPIN) 0.5 MG tablet Take 1 tablet (0.5 mg total) by mouth at bedtime. 30 tablet 0  . escitalopram (LEXAPRO) 20 MG tablet Take 1 tablet (20 mg total) by mouth daily. 90 tablet 1  . Fluticasone Propionate, Inhal, (FLOVENT DISKUS) 250 MCG/BLIST  AEPB Inhale 1 Inhaler into the lungs 2 (two) times daily. (Patient not taking: Reported on 10/04/2016) 30 each 2  . gabapentin (NEURONTIN) 300 MG capsule Take 1 capsule (300 mg total) by mouth 3 (three) times daily. 90 capsule 3  . nitroGLYCERIN (NITROSTAT) 0.4 MG SL tablet Place 1 tablet (0.4 mg total) under the tongue every 5 (five) minutes as needed for chest pain. 90 tablet 3  . omeprazole (PRILOSEC) 40 MG capsule Take 1 capsule (40 mg total) by mouth daily. 30 capsule 1  . ranitidine (ZANTAC) 150 MG tablet Take 150 mg by mouth daily as needed for heartburn.     No facility-administered medications prior to visit.      ROS Review of Systems  Constitutional: Negative for chills, fever and malaise/fatigue.  Eyes: Negative for blurred vision.  Respiratory: Negative for shortness of breath.   Cardiovascular: Negative for chest pain and palpitations.  Gastrointestinal: Positive for abdominal pain. Negative for nausea.  Genitourinary: Negative for dysuria and hematuria.  Musculoskeletal: Positive for back pain. Negative for joint pain and myalgias.  Skin: Negative for rash.  Neurological: Positive for tingling. Negative for headaches.  Psychiatric/Behavioral: Negative for depression. The patient is nervous/anxious.     Objective:  BP 108/72 (BP Location: Right Arm, Patient Position: Sitting, Cuff Size: Normal)   Pulse 69   Temp 98.4 F (36.9 C) (Oral)   Wt 159 lb  12.8 oz (72.5 kg)   SpO2 97%   BMI 25.03 kg/m   BP/Weight 11/01/2016 10/04/2016 9/41/7408  Systolic BP 144 818 563  Diastolic BP 72 75 66  Wt. (Lbs) 159.8 152.8 140  BMI 25.03 23.93 21.29      Physical Exam  Constitutional: He is oriented to person, place, and time.  Well developed, well nourished, NAD, polite  HENT:  Head: Normocephalic and atraumatic.  Eyes: No scleral icterus.  Neck: Normal range of motion. Neck supple. No thyromegaly present.  Cardiovascular: Normal rate, regular rhythm and normal heart  sounds.   Pulmonary/Chest: Effort normal and breath sounds normal.  Musculoskeletal: He exhibits no edema.  Paraspinal of the L spine are in spasm bilaterally  Neurological: He is alert and oriented to person, place, and time. No cranial nerve deficit. Coordination normal.  Skin: Skin is warm and dry. No rash noted. No erythema. No pallor.  Psychiatric: He has a normal mood and affect. His behavior is normal. Thought content normal.  Vitals reviewed.    Assessment & Plan:    1. H. pylori infection - Refill omeprazole (PRILOSEC) 40 MG capsule; Take 1 capsule (40 mg total) by mouth daily.  Dispense: 30 capsule; Refill: 3 - Begin levofloxacin (LEVAQUIN) 500 MG tablet; Take 1 tablet (500 mg total) by mouth daily.  Dispense: 10 tablet; Refill: 0  2. Generalized anxiety disorder - Refill escitalopram (LEXAPRO) 20 MG tablet; Take 1 tablet (20 mg total) by mouth daily.  Dispense: 90 tablet; Refill: 1  3. Muscle strain - Administered ketorolac (TORADOL) injection 60 mg; Inject 2 mLs (60 mg total) into the muscle once. - Begin cyclobenzaprine (FLEXERIL) 5 MG tablet; Take 1 tablet (5 mg total) by mouth at bedtime.  Dispense: 30 tablet; Refill: 0 - CK  4. Neuropathy - Vitamin B12 - TSH - ANA w/Reflex - Sedimentation Rate - CK - Increase gabapentin (NEURONTIN) 300 MG capsule; Take 2 capsules (600 mg total) by mouth 3 (three) times daily.  Dispense: 180 capsule; Refill: 3  Meds ordered this encounter  Medications  . DISCONTD: levofloxacin (LEVAQUIN) 500 MG tablet    Sig: Take 1 tablet (500 mg total) by mouth daily.    Dispense:  10 tablet    Refill:  0    Order Specific Question:   Supervising Provider    Answer:   Tresa Garter [id number]  . DISCONTD: omeprazole (PRILOSEC) 40 MG capsule    Sig: Take 1 capsule (40 mg total) by mouth daily.    Dispense:  30 capsule    Refill:  3    Order Specific Question:   Supervising Provider    Answer:   Tresa Garter [id number]  .  omeprazole (PRILOSEC) 40 MG capsule    Sig: Take 1 capsule (40 mg total) by mouth daily.    Dispense:  30 capsule    Refill:  3    Order Specific Question:   Supervising Provider    Answer:   Tresa Garter [id number]  . levofloxacin (LEVAQUIN) 500 MG tablet    Sig: Take 1 tablet (500 mg total) by mouth daily.    Dispense:  10 tablet    Refill:  0    Order Specific Question:   Supervising Provider    Answer:   Tresa Garter [id number]  . ketorolac (TORADOL) injection 60 mg  . cyclobenzaprine (FLEXERIL) 5 MG tablet    Sig: Take 1 tablet (5 mg total) by mouth at bedtime.  Dispense:  30 tablet    Refill:  0    Order Specific Question:   Supervising Provider    Answer:   Tresa Garter [id number]  . gabapentin (NEURONTIN) 300 MG capsule    Sig: Take 2 capsules (600 mg total) by mouth 3 (three) times daily.    Dispense:  180 capsule    Refill:  3    Order Specific Question:   Supervising Provider    Answer:   Tresa Garter [id number]  . escitalopram (LEXAPRO) 20 MG tablet    Sig: Take 1 tablet (20 mg total) by mouth daily.    Dispense:  90 tablet    Refill:  1    Order Specific Question:   Supervising Provider    Answer:   Tresa Garter [id number]    Follow-up: Return in about 6 weeks (around 12/13/2016).   Clent Demark PA

## 2016-11-01 NOTE — Patient Instructions (Signed)
Distensión muscular.  (Muscle Strain)  Una distensión muscular es una lesión que se produce cuando un músculo se estira más allá de su largo normal. Cuando esto sucede, por lo general se desgarra un pequeño número de fibras musculares. La distensión muscular se califica en grados. Las distensiones de primer grado son aquellas en las cuales el desgarro y el dolor afectan a la menor cantidad de fibras musculares. Las distensiones de segundo y tercer grado involucran una proporción cada vez mayor de desgarro y dolor.  En general, la recuperación de una distensión muscular tarda de 1 a 2 semanas. La curación completa tarda de 5 a 6 semanas.  CAUSAS  Las distensiones musculares ocurren cuando se aplica una fuerza violenta y repentina sobre un músculo y este se estira demasiado. Esto puede ocurrir cuando se levantan objetos, se practican deportes o en una caída.  FACTORES DE RIESGO  La distensión muscular es especialmente común en los atletas.  SIGNOS Y SÍNTOMAS  En el lugar de la distensión muscular se puede presentar lo siguiente:  · Dolor.  · Moretones.  · Hinchazón.  · Dificultad para usar el músculo debido al dolor o a un funcionamiento anormal.  DIAGNÓSTICO  El médico le hará un examen físico y le hará preguntas sobre sus antecedentes médicos.  TRATAMIENTO  Con frecuencia, el mejor tratamiento para una distensión muscular es el reposo, y la aplicación de hielo y de compresas frías en la zona de la lesión.  INSTRUCCIONES PARA EL CUIDADO EN EL HOGAR  · Use el método PRICE (por sus siglas en inglés) de tratamiento para estimular la curación durante los primeros 2 a 3 días posteriores a la lesión. El método PRICE implica lo siguiente:  ? Proteger al músculo de nuevas lesiones.  ? Limitar la actividad y descansar la parte del cuerpo lesionada.  ? Aplicar hielo a la lesión. Para hacerlo, ponga hielo en una bolsa plástica. Coloque una toalla entre la piel y la bolsa de hielo. Luego aplique el hielo y déjelo actuar de 15 a  20 minutos por hora. Después del tercer día, cambie a compresas de calor húmedo.  ? Comprimir la zona lesionada con una férula o venda elástica. Tenga cuidado de no ajustarla demasiado. Esto puede interferir con la circulación sanguínea o aumentar la hinchazón.  ? Mantener la zona lesionada por encima del nivel del corazón con la mayor frecuencia posible.  · Utilice los medicamentos de venta libre o recetados para calmar el dolor, el malestar o la fiebre, según se lo indique el médico.  · Realizar un calentamiento antes de hacer ejercicio ayuda a prevenir distensiones musculares futuras.    SOLICITE ATENCIÓN MÉDICA SI:  · Siente un dolor cada vez más intenso o hinchazón en la zona lesionada.  · Siente adormecimiento, hormigueo o nota una pérdida importante de fuerza en la zona lesionada.    ASEGÚRESE DE QUE:  · Comprende estas instrucciones.  · Controlará su afección.  · Recibirá ayuda de inmediato si no mejora o si empeora.    Esta información no tiene como fin reemplazar el consejo del médico. Asegúrese de hacerle al médico cualquier pregunta que tenga.  Document Released: 12/07/2004 Document Revised: 12/18/2012 Document Reviewed: 09/26/2012  Elsevier Interactive Patient Education © 2017 [employer]

## 2016-11-02 LAB — ANA W/REFLEX: Anti Nuclear Antibody(ANA): NEGATIVE

## 2016-11-02 LAB — TSH: TSH: 2.13 u[IU]/mL (ref 0.450–4.500)

## 2016-11-02 LAB — CK: CK TOTAL: 184 U/L (ref 24–204)

## 2016-11-02 LAB — SEDIMENTATION RATE: Sed Rate: 2 mm/hr (ref 0–15)

## 2016-11-02 LAB — VITAMIN B12: Vitamin B-12: 710 pg/mL (ref 232–1245)

## 2016-12-25 ENCOUNTER — Encounter (INDEPENDENT_AMBULATORY_CARE_PROVIDER_SITE_OTHER): Payer: Self-pay | Admitting: Physician Assistant

## 2016-12-25 ENCOUNTER — Ambulatory Visit (INDEPENDENT_AMBULATORY_CARE_PROVIDER_SITE_OTHER): Payer: Medicaid Other | Admitting: Physician Assistant

## 2016-12-25 VITALS — BP 107/71 | HR 67 | Temp 98.1°F | Wt 151.0 lb

## 2016-12-25 DIAGNOSIS — F4323 Adjustment disorder with mixed anxiety and depressed mood: Secondary | ICD-10-CM | POA: Diagnosis not present

## 2016-12-25 DIAGNOSIS — A048 Other specified bacterial intestinal infections: Secondary | ICD-10-CM

## 2016-12-25 DIAGNOSIS — F411 Generalized anxiety disorder: Secondary | ICD-10-CM | POA: Diagnosis not present

## 2016-12-25 MED ORDER — LEVOFLOXACIN 500 MG PO TABS: 500.0000 mg | ORAL_TABLET | Freq: Every day | ORAL | 0 refills | Status: DC

## 2016-12-25 MED ORDER — ESCITALOPRAM OXALATE 20 MG PO TABS: 20.0000 mg | ORAL_TABLET | Freq: Every day | ORAL | 1 refills | Status: DC

## 2016-12-25 MED ORDER — CLONAZEPAM 0.5 MG PO TABS: 0.5000 mg | ORAL_TABLET | Freq: Every day | ORAL | 0 refills | Status: DC

## 2016-12-25 NOTE — Progress Notes (Signed)
Subjective:  Patient ID: Derek Smith, male    DOB: 1970/10/08  Age: 46 y.o. MRN: [id number]  CC: f/u  HPI Derek Smith is a 46 y.o. male with a medical history of anxiety, LBP w/sciatica, and syncope. Patient is currently having marrtal disagreements. Currently sleeping on the couch and figuring Says he will be needing a refill of his SSRI. Escitalopram and Clonazepam had been refilled in the last visit. He is holding on to his faith as a main source of support.     Has not taken Levaquin for his H pylori infection. Says the pharmacy at [facility] refused to fill his prescription, unknown why. Says PPI helps but epigastric pain seems to linger. Does not endorse melena, BRBPR, or any other symptoms.      Outpatient Medications Prior to Visit  Medication Sig Dispense Refill  . acetaminophen (TYLENOL) 500 MG tablet Take 1,000 mg by mouth daily as needed for moderate pain or headache.    . albuterol (PROVENTIL HFA;VENTOLIN HFA) 108 (90 Base) MCG/ACT inhaler Inhale 2 puffs into the lungs every 4 (four) hours as needed for wheezing or shortness of breath. (Patient not taking: Reported on 10/04/2016) 1 Inhaler 2  . aspirin EC 81 MG tablet Take 81 mg by mouth every evening.    . clonazePAM (KLONOPIN) 0.5 MG tablet Take 1 tablet (0.5 mg total) by mouth at bedtime. 30 tablet 0  . cyclobenzaprine (FLEXERIL) 5 MG tablet Take 1 tablet (5 mg total) by mouth at bedtime. 30 tablet 0  . escitalopram (LEXAPRO) 20 MG tablet Take 1 tablet (20 mg total) by mouth daily. 90 tablet 1  . Fluticasone Propionate, Inhal, (FLOVENT DISKUS) 250 MCG/BLIST AEPB Inhale 1 Inhaler into the lungs 2 (two) times daily. (Patient not taking: Reported on 10/04/2016) 30 each 2  . gabapentin (NEURONTIN) 300 MG capsule Take 2 capsules (600 mg total) by mouth 3 (three) times daily. 180 capsule 3  . levofloxacin (LEVAQUIN) 500 MG tablet Take 1 tablet (500 mg total) by mouth daily. 10 tablet 0  . nitroGLYCERIN (NITROSTAT) 0.4 MG  SL tablet Place 1 tablet (0.4 mg total) under the tongue every 5 (five) minutes as needed for chest pain. 90 tablet 3  . omeprazole (PRILOSEC) 40 MG capsule Take 1 capsule (40 mg total) by mouth daily. 30 capsule 3  . ranitidine (ZANTAC) 150 MG tablet Take 150 mg by mouth daily as needed for heartburn.     No facility-administered medications prior to visit.      ROS Review of Systems  Constitutional: Negative for chills, fever and malaise/fatigue.  Eyes: Negative for blurred vision.  Respiratory: Negative for shortness of breath.   Cardiovascular: Negative for chest pain and palpitations.  Gastrointestinal: Negative for abdominal pain and nausea.  Genitourinary: Negative for dysuria and hematuria.  Musculoskeletal: Negative for joint pain and myalgias.  Skin: Negative for rash.  Neurological: Negative for tingling and headaches.  Psychiatric/Behavioral: Negative for depression. The patient is not nervous/anxious.     Objective:  BP 107/71 (BP Location: Left Arm, Patient Position: Sitting, Cuff Size: Normal)   Pulse 67   Temp 98.1 F (36.7 C) (Oral)   Wt 151 lb (68.5 kg)   SpO2 100%   BMI 23.65 kg/m   BP/Weight 12/25/2016 11/01/2016 7/34/1937  Systolic BP 902 409 735  Diastolic BP 71 72 75  Wt. (Lbs) 151 159.8 152.8  BMI 23.65 25.03 23.93      Physical Exam  Constitutional: He is oriented to person, place, and  time.  Well developed, well nourished, NAD, polite  HENT:  Head: Normocephalic and atraumatic.  Eyes: No scleral icterus.  Cardiovascular: Normal rate, regular rhythm and normal heart sounds.   Pulmonary/Chest: Effort normal.  Abdominal: Bowel sounds are normal.  Musculoskeletal: He exhibits no edema.  Neurological: He is alert and oriented to person, place, and time.  Skin: Skin is warm and dry. No rash noted. No erythema. No pallor.  Psychiatric: He has a normal mood and affect. His behavior is normal. Thought content normal.  Vitals  reviewed.    Assessment & Plan:    1. Adjustment disorder with mixed anxiety and depressed mood - marital strife  2. Generalized anxiety disorder - Refill clonazePAM (KLONOPIN) 0.5 MG tablet; Take 1 tablet (0.5 mg total) by mouth at bedtime.  Dispense: 30 tablet; Refill: 0 - Refill escitalopram (LEXAPRO) 20 MG tablet; Take 1 tablet (20 mg total) by mouth daily.  Dispense: 90 tablet; Refill: 1  3. H. pylori infection - Begin levofloxacin (LEVAQUIN) 500 MG tablet; Take 1 tablet (500 mg total) by mouth daily.  Dispense: 10 tablet; Refill: 0   Meds ordered this encounter  Medications  . DISCONTD: escitalopram (LEXAPRO) 20 MG tablet    Sig: Take 1 tablet (20 mg total) by mouth daily.    Dispense:  90 tablet    Refill:  1    Order Specific Question:   Supervising Provider    Answer:   Tresa Garter [id number]  . clonazePAM (KLONOPIN) 0.5 MG tablet    Sig: Take 1 tablet (0.5 mg total) by mouth at bedtime.    Dispense:  30 tablet    Refill:  0    Order Specific Question:   Supervising Provider    Answer:   Tresa Garter [id number]  . escitalopram (LEXAPRO) 20 MG tablet    Sig: Take 1 tablet (20 mg total) by mouth daily.    Dispense:  90 tablet    Refill:  1    Order Specific Question:   Supervising Provider    Answer:   Tresa Garter [id number]  . levofloxacin (LEVAQUIN) 500 MG tablet    Sig: Take 1 tablet (500 mg total) by mouth daily.    Dispense:  10 tablet    Refill:  0    Order Specific Question:   Supervising Provider    Answer:   Tresa Garter [id number]    Follow-up: Return in about 8 weeks (around 02/19/2017) for 6 weeks.   Clent Demark PA

## 2016-12-25 NOTE — Patient Instructions (Signed)
   Trastorno de ansiedad generalizada (Generalized Anxiety Disorder) El trastorno de ansiedad generalizada es un trastorno mental. Interfiere en las funciones vitales, incluyendo las relaciones, el trabajo y la escuela.  Es diferente de la ansiedad normal que todas las personas experimentan en algn momento de su vida en respuesta a sucesos y actividades especficas. En verdad, la ansiedad normal nos ayuda a prepararnos y atravesar estos acontecimientos y actividades de la vida. La ansiedad normal desaparece despus de que el evento o la actividad ha finalizado.  El trastorno de ansiedad generalizada no est necesariamente relacionada con eventos o actividades especficas. Tambin causa un exceso de ansiedad en proporcin a sucesos o actividades especficas. En este trastorno la ansiedad es difcil de controlar. Los sntomas pueden variar de leves a muy graves. Las personas que sufren de trastorno de ansiedad generalizada pueden tener intensas olas de ansiedad con sntomas fsicos (ataques de pnico).  SNTOMAS  La ansiedad y la preocupacin asociada a este trastorno son difciles de controlar. Esta ansiedad y la preocupacin estn relacionados con muchos eventos de la vida y sus actividades y tambin ocurre durante ms das de los que no ocurre, durante 6 meses o ms. Las personas que la sufren pueden tener tres o ms de los siguientes sntomas (uno o ms en los nios):   Agitacin   Fatiga.  Dificultades de concentracin.   Irritabilidad.  Tensin muscular  Dificultad para dormirse o sueo poco satisfactorio. DIAGNSTICO  Se diagnostica a travs de una evaluacin realizada por el mdico. El mdico le har preguntas acerca de su estado de nimo, sntomas fsicos y sucesos de su vida. Le har preguntas sobre su historia clnica, el consumo de alcohol o drogas, incluyendo los medicamentos recetados. Tambin le har un examen fsico e indicar anlisis de sangre. Ciertas enfermedades y el uso de  determinadas sustancias pueden causar sntomas similares a este trastorno. Su mdico lo puede derivar a un especialista en salud mental para una evaluacin ms profunda..  TRATAMIENTO  Las terapias siguientes se utilizan en el tratamiento de este trastorno:   Medicamentos - Se recetan antidepresivos para el control diario a largo plazo. Pueden indicarse tambin medicamentos para combatir la ansiedad en los casos graves, especialmente cuando ocurren ataques de pnico.   Terapia conversada (psicoterapia) Ciertos tipos de psicoterapia pueden ser tiles en el tratamiento del trastorno de ansiedad generalizada, proporcionando apoyo, educacin y orientacin. Una forma de psicoterapia llamada terapia cognitivo-conductual puede ensearle formas saludables de pensar y reaccionar a los eventos y actividades de la vida diaria.  Tcnicasde manejo del estrs- Estas tcnicas incluyen el yoga, la meditacin y el ejercicio y pueden ser muy tiles cuando se practican con regularidad. Un especialista en salud mental puede ayudar a determinar qu tratamiento es mejor para usted. Algunas personas obtienen mejora con una terapia. Sin embargo, otras personas requieren una combinacin de terapias.  Esta informacin no tiene como fin reemplazar el consejo del mdico. Asegrese de hacerle al mdico cualquier pregunta que tenga. Document Released: 06/24/2012 Document Revised: 03/20/2014 Elsevier Interactive Patient Education  2017 Elsevier Inc.  

## 2017-02-19 ENCOUNTER — Ambulatory Visit (INDEPENDENT_AMBULATORY_CARE_PROVIDER_SITE_OTHER): Payer: Medicaid Other | Admitting: Physician Assistant

## 2017-03-22 ENCOUNTER — Encounter (HOSPITAL_COMMUNITY): Payer: Self-pay

## 2017-03-22 ENCOUNTER — Emergency Department (HOSPITAL_COMMUNITY)
Admission: EM | Admit: 2017-03-22 | Discharge: 2017-03-22 | Disposition: A | Discharge status: Home / Self Care | Payer: Medicaid Other | Attending: Emergency Medicine | Admitting: Emergency Medicine

## 2017-03-22 ENCOUNTER — Emergency Department (HOSPITAL_COMMUNITY): Payer: Medicaid Other

## 2017-03-22 ENCOUNTER — Telehealth (INDEPENDENT_AMBULATORY_CARE_PROVIDER_SITE_OTHER): Payer: Self-pay | Admitting: Physician Assistant

## 2017-03-22 DIAGNOSIS — Z7982 Long term (current) use of aspirin: Secondary | ICD-10-CM | POA: Diagnosis not present

## 2017-03-22 DIAGNOSIS — R29810 Facial weakness: Secondary | ICD-10-CM | POA: Diagnosis present

## 2017-03-22 DIAGNOSIS — G510 Bell's palsy: Secondary | ICD-10-CM | POA: Insufficient documentation

## 2017-03-22 DIAGNOSIS — R55 Syncope and collapse: Secondary | ICD-10-CM | POA: Insufficient documentation

## 2017-03-22 LAB — COMPREHENSIVE METABOLIC PANEL
ALBUMIN: 4.2 g/dL (ref 3.5–5.0)
ALK PHOS: 57 U/L (ref 38–126)
ALT: 20 U/L (ref 17–63)
AST: 20 U/L (ref 15–41)
Anion gap: 8 (ref 5–15)
BUN: 13 mg/dL (ref 6–20)
CALCIUM: 9.3 mg/dL (ref 8.9–10.3)
CO2: 29 mmol/L (ref 22–32)
Chloride: 102 mmol/L (ref 101–111)
Creatinine, Ser: 0.98 mg/dL (ref 0.61–1.24)
GFR calc Af Amer: >60 mL/min (ref >60)
GFR calc non Af Amer: >60 mL/min (ref >60)
GLUCOSE: 95 mg/dL (ref 65–99)
Potassium: 4.2 mmol/L (ref 3.5–5.1)
Sodium: 139 mmol/L (ref 135–145)
TOTAL PROTEIN: 7.3 g/dL (ref 6.5–8.1)
Total Bilirubin: 0.7 mg/dL (ref 0.3–1.2)

## 2017-03-22 LAB — DIFFERENTIAL
BASOS ABS: 0 10*3/uL (ref 0.0–0.1)
Basophils Relative: 1 %
Eosinophils Absolute: 0.2 10*3/uL (ref 0.0–0.7)
Eosinophils Relative: 3 %
LYMPHS ABS: 2 10*3/uL (ref 0.7–4.0)
LYMPHS PCT: 36 %
Monocytes Absolute: 0.6 10*3/uL (ref 0.1–1.0)
Monocytes Relative: 10 %
NEUTROS ABS: 2.8 10*3/uL (ref 1.7–7.7)
NEUTROS PCT: 50 %

## 2017-03-22 LAB — I-STAT CHEM 8, ED
BUN: 16 mg/dL (ref 6–20)
CHLORIDE: 100 mmol/L — AB (ref 101–111)
CREATININE: 1 mg/dL (ref 0.61–1.24)
Calcium, Ion: 1.21 mmol/L (ref 1.15–1.40)
Glucose, Bld: 94 mg/dL (ref 65–99)
HCT: 46 % (ref 39.0–52.0)
Hemoglobin: 15.6 g/dL (ref 13.0–17.0)
Potassium: 4.3 mmol/L (ref 3.5–5.1)
SODIUM: 141 mmol/L (ref 135–145)
TCO2: 32 mmol/L (ref 22–32)

## 2017-03-22 LAB — CBC
HCT: 44.4 % (ref 39.0–52.0)
HEMOGLOBIN: 14.9 g/dL (ref 13.0–17.0)
MCH: 30.4 pg (ref 26.0–34.0)
MCHC: 33.6 g/dL (ref 30.0–36.0)
MCV: 90.6 fL (ref 78.0–100.0)
Platelets: 242 10*3/uL (ref 150–400)
RBC: 4.9 MIL/uL (ref 4.22–5.81)
RDW: 11.8 % (ref 11.5–15.5)
WBC: 5.5 10*3/uL (ref 4.0–10.5)

## 2017-03-22 LAB — I-STAT TROPONIN, ED: Troponin i, poc: 0.01 ng/mL (ref 0.00–0.08)

## 2017-03-22 LAB — PROTIME-INR
INR: 1.01
Prothrombin Time: 13.2 seconds (ref 11.4–15.2)

## 2017-03-22 LAB — APTT: aPTT: 30 seconds (ref 24–36)

## 2017-03-22 MED ORDER — ACYCLOVIR 400 MG PO TABS: 400.0000 mg | ORAL_TABLET | Freq: Three times a day (TID) | ORAL | 0 refills | Status: AC

## 2017-03-22 MED ORDER — HYPROMELLOSE (GONIOSCOPIC) 2.5 % OP SOLN: 1.0000 [drp] | Freq: Four times a day (QID) | OPHTHALMIC | 12 refills | Status: DC

## 2017-03-22 MED ORDER — PREDNISONE 20 MG PO TABS: 40.0000 mg | ORAL_TABLET | Freq: Every day | ORAL | 0 refills | Status: AC

## 2017-03-22 NOTE — ED Provider Notes (Signed)
[facility] EMERGENCY DEPARTMENT Provider Note   CSN: [id number] Arrival date & time: 03/22/17  1124     History   Chief Complaint Chief Complaint  Patient presents with  . Stroke Symptoms    HPI Derek Smith is a 47 y.o. male.  HPI 47 year old male who presents to the emergency department with complaints of left-sided facial weakness and difficulty blinking his left eye since Monday.  He denies weakness of his arms or legs.  No shortness of breath.  No abdominal pain.  No back pain.  Patient has never had presenting symptoms like this before.  He has had no improvement in his symptoms.   Past Medical History:  Diagnosis Date  . Anxiety   . 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 Syncope 03/28/2016    Patient Active Problem List   Diagnosis Date Noted  . Precordial chest pain   . Syncope 03/28/2016    Past Surgical History:  Procedure Laterality Date  . LEFT HEART CATH AND CORONARY ANGIOGRAPHY N/A 05/22/2016   Procedure: Left Heart Cath and Coronary Angiography;  Surgeon: Jettie Booze, MD;  Location: [facility];  Service: Cardiovascular;  Laterality: N/A;  . NO PAST SURGERIES         Home Medications    Prior to Admission medications   Medication Sig Start Date End Date Taking? Authorizing Provider  acetaminophen (TYLENOL) 500 MG tablet Take 1,000 mg by mouth daily as needed for moderate pain or headache.    [provider]  acyclovir (ZOVIRAX) 400 MG tablet Take 1 tablet (400 mg total) by mouth 3 (three) times daily for 7 days. 03/22/17 03/29/17  Jola Schmidt, MD  albuterol (PROVENTIL HFA;VENTOLIN HFA) 108 (90 Base) MCG/ACT inhaler Inhale 2 puffs into the lungs every 4 (four) hours as needed for wheezing or shortness of breath. Patient not taking: Reported on 10/04/2016 03/01/16   Alfonse Spruce, FNP  aspirin EC 81 MG tablet Take 81 mg by mouth every evening.    [provider]  clonazePAM  (KLONOPIN) 0.5 MG tablet Take 1 tablet (0.5 mg total) by mouth at bedtime. 12/25/16   Clent Demark, PA-C  cyclobenzaprine (FLEXERIL) 5 MG tablet Take 1 tablet (5 mg total) by mouth at bedtime. 11/01/16   Clent Demark, PA-C  escitalopram (LEXAPRO) 20 MG tablet Take 1 tablet (20 mg total) by mouth daily. 12/25/16   Clent Demark, PA-C  Fluticasone Propionate, Inhal, (FLOVENT DISKUS) 250 MCG/BLIST AEPB Inhale 1 Inhaler into the lungs 2 (two) times daily. Patient not taking: Reported on 10/04/2016 03/01/16   Alfonse Spruce, FNP  gabapentin (NEURONTIN) 300 MG capsule Take 2 capsules (600 mg total) by mouth 3 (three) times daily. 11/01/16   Clent Demark, PA-C  levofloxacin (LEVAQUIN) 500 MG tablet Take 1 tablet (500 mg total) by mouth daily. 12/25/16   Clent Demark, PA-C  nitroGLYCERIN (NITROSTAT) 0.4 MG SL tablet Place 1 tablet (0.4 mg total) under the tongue every 5 (five) minutes as needed for chest pain. 05/18/16 08/16/16  Jettie Booze, MD  omeprazole (PRILOSEC) 40 MG capsule Take 1 capsule (40 mg total) by mouth daily. 11/01/16   Clent Demark, PA-C  predniSONE (DELTASONE) 20 MG tablet Take 2 tablets (40 mg total) by mouth daily for 5 days. 03/22/17 03/27/17  Jola Schmidt, MD  ranitidine (ZANTAC) 150 MG tablet Take 150 mg by mouth daily as needed for heartburn.    [provider]    Family History Family History  Problem Relation Age of Onset  . GI problems Mother     Social History Social History   Tobacco Use  . Smoking status: Never Smoker  . Smokeless tobacco: Never Used  Substance Use Topics  . Alcohol use: No  . Drug use: Not on file     Allergies   Patient has no known allergies.   Review of Systems Review of Systems  All other systems reviewed and are negative.    Physical Exam Updated Vital Signs BP 109/68 (BP Location: Right Arm)   Pulse 62   Temp 97.7 F (36.5 C) (Oral)   Resp 17   Ht 5\' 7"  (1.702 m)   Wt 65.8  kg (145 lb)   SpO2 100%   BMI 22.71 kg/m   Physical Exam  Constitutional: He is oriented to person, place, and time. He appears well-developed and well-nourished.  HENT:  Head: Normocephalic and atraumatic.  Eyes: EOM are normal. Pupils are equal, round, and reactive to light.  Neck: Normal range of motion.  Cardiovascular: Normal rate, regular rhythm, normal heart sounds and intact distal pulses.  Pulmonary/Chest: Effort normal and breath sounds normal. No respiratory distress.  Abdominal: Soft. He exhibits no distension. There is no tenderness.  Musculoskeletal: Normal range of motion.  Neurological: He is alert and oriented to person, place, and time.  5/5 strength in major muscle groups of  bilateral upper and lower extremities. Speech normal.  Left-sided facial weakness.  Weakness of his left forehead muscles consistent with Bell's palsy  Skin: Skin is warm and dry.  Psychiatric: He has a normal mood and affect. Judgment normal.  Nursing note and vitals reviewed.    ED Treatments / Results  Labs (all labs ordered are listed, but only abnormal results are displayed) Labs Reviewed  I-STAT CHEM 8, ED - Abnormal; Notable for the following components:      Result Value   Chloride 100 (*)    All other components within normal limits  PROTIME-INR  APTT  CBC  DIFFERENTIAL  COMPREHENSIVE METABOLIC PANEL  I-STAT TROPONIN, ED    EKG  EKG Interpretation  Date/Time:  Thursday March 22 2017 11:32:22 EST Ventricular Rate:  53 PR Interval:  156 QRS Duration: 92 QT Interval:  424 QTC Calculation: 397 R Axis:   85 Text Interpretation:  Sinus bradycardia Left ventricular hypertrophy Abnormal ECG Since last tracing rate slower Confirmed by Dorie Rank [phone]) on 03/22/2017 12:11:54 PM       Radiology Ct Head Wo Contrast  Result Date: 03/22/2017 CLINICAL DATA:  CT head w/o contrast due to neuro deficits subacute Pt was coming from home with complaints of left sided facial  droop and loss of left eye blinking that started on Monday. Reports left-sided chest pain and left arm pain and left arm tiredness. Facial droop. EXAM: CT HEAD WITHOUT CONTRAST TECHNIQUE: Contiguous axial images were obtained from the base of the skull through the vertex without intravenous contrast. COMPARISON:  None. FINDINGS: Brain: No evidence of acute infarction, hemorrhage, hydrocephalus, extra-axial collection or mass lesion/mass effect. Vascular: No hyperdense vessel or unexpected calcification. Skull: Normal. Negative for fracture or focal lesion. Sinuses/Orbits: No acute finding. Other: None IMPRESSION: Negative exam. Electronically Signed   By: Nolon Nations M.D.   On: 03/22/2017 12:43    Procedures Procedures (including critical care time)  Medications Ordered in ED Medications - No data to display   Initial Impression / Assessment and Plan /  ED Course  I have reviewed the triage vital signs and the nursing notes.  Pertinent labs & imaging results that were available during my care of the patient were reviewed by me and considered in my medical decision making (see chart for details).     Left-sided Bell's palsy.  Workup initiated prior to my evaluation.  Patient be discharged home on antiviral medications steroids as well as instructions for irrigating drops for the left eye   Final Clinical Impressions(s) / ED Diagnoses   Final diagnoses:  Left-sided Bell's palsy    ED Discharge Orders        Ordered    predniSONE (DELTASONE) 20 MG tablet  Daily     03/22/17 1858    acyclovir (ZOVIRAX) 400 MG tablet  3 times daily     03/22/17 Rutha Bouchard, MD 03/22/17 1900

## 2017-03-22 NOTE — ED Triage Notes (Signed)
Through [employer] Interpreter:   Pt was coming from home with complaints of left sided facial droop and loss of left eye blinking that started on Monday. Reports left-sided chest pain and left arm pain along with left arm "tiredness" Pt has no medical hx. Neurological assessment noted facial droop, but no other symptoms noted on exam. Equal strength.

## 2017-03-22 NOTE — Telephone Encounter (Signed)
Correct advise for patient to go to ED.

## 2017-03-22 NOTE — ED Notes (Signed)
Declined W/C at D/C and was escorted to lobby by RN. 

## 2017-03-22 NOTE — Telephone Encounter (Signed)
Pt called to inform that her husband look like he has a stroke since the front of the face and eye move to one since, per nurse she was intrusted to go the ED now and to call us back after been see there

## 2017-04-04 ENCOUNTER — Ambulatory Visit (INDEPENDENT_AMBULATORY_CARE_PROVIDER_SITE_OTHER): Payer: Medicaid Other | Admitting: Physician Assistant

## 2017-04-04 ENCOUNTER — Other Ambulatory Visit: Payer: Self-pay

## 2017-04-04 ENCOUNTER — Encounter (INDEPENDENT_AMBULATORY_CARE_PROVIDER_SITE_OTHER): Payer: Self-pay | Admitting: Physician Assistant

## 2017-04-04 VITALS — BP 127/74 | HR 73 | Temp 97.8°F | Wt 149.4 lb

## 2017-04-04 DIAGNOSIS — F418 Other specified anxiety disorders: Secondary | ICD-10-CM | POA: Diagnosis not present

## 2017-04-04 DIAGNOSIS — Z760 Encounter for issue of repeat prescription: Secondary | ICD-10-CM | POA: Diagnosis not present

## 2017-04-04 DIAGNOSIS — A048 Other specified bacterial intestinal infections: Secondary | ICD-10-CM

## 2017-04-04 DIAGNOSIS — G510 Bell's palsy: Secondary | ICD-10-CM | POA: Diagnosis not present

## 2017-04-04 DIAGNOSIS — G629 Polyneuropathy, unspecified: Secondary | ICD-10-CM | POA: Diagnosis not present

## 2017-04-04 MED ORDER — CLONAZEPAM 0.5 MG PO TABS: 0.5000 mg | ORAL_TABLET | Freq: Every day | ORAL | 0 refills | Status: DC

## 2017-04-04 MED ORDER — GABAPENTIN 300 MG PO CAPS: 600.0000 mg | ORAL_CAPSULE | Freq: Three times a day (TID) | ORAL | 3 refills | Status: DC

## 2017-04-04 MED ORDER — PREDNISONE 10 MG PO TABS: 10.0000 mg | ORAL_TABLET | Freq: Every day | ORAL | 0 refills | Status: DC

## 2017-04-04 MED ORDER — ESCITALOPRAM OXALATE 20 MG PO TABS: 20.0000 mg | ORAL_TABLET | Freq: Every day | ORAL | 1 refills | Status: DC

## 2017-04-04 NOTE — Progress Notes (Signed)
Subjective:  Patient ID: Derek Smith, male    DOB: 1970-04-07  Age: 47 y.o. MRN: [id number]  CC:  Hospital f/u  HPI  Derek Smith is a 47 y.o. male with a medical history of anxiety, LBP w/sciatica, H pylori infection, and syncope presents on hospital f/u for left sided Bell's Palsy. Went to ED thirteen days ago. Prescribed Prednisone and Acyclovir. Finished medications but still has some residual paralysis of the left side of face.        Generalized anxiety disorder is worse now due to Bell's Palsy but also has other exacerbating factors to include inconsistent work hours, marital issues, mother recently diagnosed with cancer, and sister was injured in a hit and run. He is currently feeling depressed and with suicidal ideation. Did not try suicide because he is fearful of going to hell. He wants to go back to [place] and is planning to move in November 2019. Current emotional support system is a friend from church. Says Escitalopram and Clonazepam have been instrumental in having a more calculated and measured approach to his stressors.     H pylori infection treatment has been delayed due to difficulties obtaining treatment and misunderstanding on how to take abx. Was prescribed Levofloxacin at last visit. Says he took Levofloxacin and feels much better in regards to epigastric pain. Thinks his current stresses may on occasion aggravate his epigastric pain. Otherwise he is asymptomatic.    Outpatient Medications Prior to Visit  Medication Sig Dispense Refill  . acetaminophen (TYLENOL) 500 MG tablet Take 1,000 mg by mouth daily as needed for moderate pain or headache.    . albuterol (PROVENTIL HFA;VENTOLIN HFA) 108 (90 Base) MCG/ACT inhaler Inhale 2 puffs into the lungs every 4 (four) hours as needed for wheezing or shortness of breath. (Patient not taking: Reported on 10/04/2016) 1 Inhaler 2  . aspirin EC 81 MG tablet Take 81 mg by mouth every evening.    . clonazePAM  (KLONOPIN) 0.5 MG tablet Take 1 tablet (0.5 mg total) by mouth at bedtime. 30 tablet 0  . cyclobenzaprine (FLEXERIL) 5 MG tablet Take 1 tablet (5 mg total) by mouth at bedtime. 30 tablet 0  . escitalopram (LEXAPRO) 20 MG tablet Take 1 tablet (20 mg total) by mouth daily. 90 tablet 1  . Fluticasone Propionate, Inhal, (FLOVENT DISKUS) 250 MCG/BLIST AEPB Inhale 1 Inhaler into the lungs 2 (two) times daily. (Patient not taking: Reported on 10/04/2016) 30 each 2  . gabapentin (NEURONTIN) 300 MG capsule Take 2 capsules (600 mg total) by mouth 3 (three) times daily. 180 capsule 3  . hydroxypropyl methylcellulose / hypromellose (ISOPTO TEARS / GONIOVISC) 2.5 % ophthalmic solution Place 1 drop into the left eye 4 (four) times daily. 15 mL 12  . levofloxacin (LEVAQUIN) 500 MG tablet Take 1 tablet (500 mg total) by mouth daily. 10 tablet 0  . nitroGLYCERIN (NITROSTAT) 0.4 MG SL tablet Place 1 tablet (0.4 mg total) under the tongue every 5 (five) minutes as needed for chest pain. 90 tablet 3  . omeprazole (PRILOSEC) 40 MG capsule Take 1 capsule (40 mg total) by mouth daily. 30 capsule 3  . ranitidine (ZANTAC) 150 MG tablet Take 150 mg by mouth daily as needed for heartburn.     No facility-administered medications prior to visit.      ROS Review of Systems  Constitutional: Negative for chills, fever and malaise/fatigue.  Eyes: Negative for blurred vision.  Respiratory: Negative for shortness of breath.   Cardiovascular:  Negative for chest pain and palpitations.  Gastrointestinal: Negative for abdominal pain and nausea.  Genitourinary: Negative for dysuria and hematuria.  Musculoskeletal: Negative for joint pain and myalgias.  Skin: Negative for rash.  Neurological: Positive for sensory change and focal weakness. Negative for tingling and headaches.  Psychiatric/Behavioral: Positive for depression and suicidal ideas. Negative for substance abuse. The patient is nervous/anxious.     Objective:  BP  127/74 (BP Location: Left Arm, Patient Position: Sitting, Cuff Size: Normal)   Pulse 73   Temp 97.8 F (36.6 C) (Oral)   Wt 149 lb 6.4 oz (67.8 kg)   SpO2 96%   BMI 23.40 kg/m   BP/Weight 04/04/2017 03/22/2017 32/95/1884  Systolic BP 166 063 016  Diastolic BP 74 70 71  Wt. (Lbs) 149.4 145 151  BMI 23.4 22.71 23.65      Physical Exam  Constitutional: He is oriented to person, place, and time.  Well developed, well nourished, NAD, polite  HENT:  Head: Normocephalic and atraumatic.  Eyes: No scleral icterus.  Neck: Normal range of motion. Neck supple. No thyromegaly present.  Cardiovascular: Normal rate, regular rhythm and normal heart sounds.  Pulmonary/Chest: Effort normal and breath sounds normal.  Abdominal: Soft. Bowel sounds are normal. There is no tenderness.  Musculoskeletal: He exhibits no edema.  Neurological: He is alert and oriented to person, place, and time.  Partial paralysis of the left side of face. Reduced blink response, reduced wrinkling of forehead, and drooping corner of mouth   Skin: Skin is warm and dry. No rash noted. No erythema. No pallor.  Psychiatric: He has a normal mood and affect. His behavior is normal. Thought content normal.  Vitals reviewed.    Assessment & Plan:   1. Bell's palsy - Begin predniSONE (DELTASONE) 10 MG tablet; Take 1 tablet (10 mg total) by mouth daily with breakfast. Day 1 take 6 tablets  Day 2 take 6 tablets Day 3 take 5 tablets  Day 4 take 5 tablets   Day 5 take 4 tablets  Day 6 take 4 tablets  Day 7 take 3 tablets   Day 8 take 3 tablets  Day 9 take 2 tablets Day 10 take 2 tablets  Day 11 take 1 tablet   Day 12 take 1 tablet  Dispense: 42 tablet; Refill: 0 - Last dose of prednisone from ED was 40 mg and for 5 days. Needed prolonged treatment of at least 10 days. Patient exhibits some improvement but he will now receive a longer and tapered dose.   2. Depression with anxiety - Refill escitalopram (LEXAPRO) 20 MG tablet;  Take 1 tablet (20 mg total) by mouth daily.  Dispense: 90 tablet; Refill: 1 - Refill clonazePAM (KLONOPIN) 0.5 MG tablet; Take 1 tablet (0.5 mg total) by mouth at bedtime.  Dispense: 30 tablet; Refill: 0 - I have counseled patient and provided ample time for patient to discuss his issues. I have given him my on call cell phone number should he feel that he is in crisis. I also provided [facility] information should he feel the need to walk in and see a psychologist.   3. H. pylori infection - H. pylori breath test  4. Medication refill - gabapentin (NEURONTIN) 300 MG capsule; Take 2 capsules (600 mg total) by mouth 3 (three) times daily.  Dispense: 180 capsule; Refill: 3  5. Neuropathy - gabapentin (NEURONTIN) 300 MG capsule; Take 2 capsules (600 mg total) by mouth 3 (three) times daily.  Dispense: 180 capsule;  Refill: 3   Meds ordered this encounter  Medications  . escitalopram (LEXAPRO) 20 MG tablet    Sig: Take 1 tablet (20 mg total) by mouth daily.    Dispense:  90 tablet    Refill:  1    Order Specific Question:   Supervising Provider    Answer:   Tresa Garter [id number]  . predniSONE (DELTASONE) 10 MG tablet    Sig: Take 1 tablet (10 mg total) by mouth daily with breakfast. Day 1 take 6 tablets  Day 2 take 6 tablets Day 3 take 5 tablets  Day 4 take 5 tablets   Day 5 take 4 tablets  Day 6 take 4 tablets  Day 7 take 3 tablets   Day 8 take 3 tablets  Day 9 take 2 tablets Day 10 take 2 tablets  Day 11 take 1 tablet   Day 12 take 1 tablet    Dispense:  42 tablet    Refill:  0    Order Specific Question:   Supervising Provider    Answer:   Tresa Garter [id number]  . clonazePAM (KLONOPIN) 0.5 MG tablet    Sig: Take 1 tablet (0.5 mg total) by mouth at bedtime.    Dispense:  30 tablet    Refill:  0    Order Specific Question:   Supervising Provider    Answer:   Tresa Garter [id number]  . gabapentin (NEURONTIN) 300 MG capsule    Sig: Take 2 capsules (600 mg  total) by mouth 3 (three) times daily.    Dispense:  180 capsule    Refill:  3    Order Specific Question:   Supervising Provider    Answer:   Tresa Garter [[id number]]    Follow-up: Return in about 4 weeks (around 05/02/2017) for depression with anxiety.   Clent Demark PA

## 2017-04-04 NOTE — Patient Instructions (Signed)
Parlisis facial (Bell Palsy) La parlisis facial es una afeccin en la que se paralizan los msculos de un lado de la cara. Esto a menudo provoca la cada de un lado de la cara. Es una afeccin frecuente y [place] se recuperan por completo. [place] riesgo de la parlisis facial incluyen:  Media planner.  Diabetes.  Una infeccin provocada por un virus, como las infecciones que causan llagas peribucales. CAUSAS  La parlisis facial es ocasionada por un dao o una inflamacin de un nervio de la cara. No est claro por qu sucede, pero puede ocurrir a causa de una infeccin provocada por un virus. La mayora de las veces se desconoce la causa. [place]er SNTOMAS  Los sntomas pueden variar de leves a graves y pueden durar varias horas. Entre los sntomas se pueden incluir los siguientes:  No poder Optometrist lo siguiente: ? [place] cejas. ? [employer]. ? Sentir partes de la cara (entumecimiento facial).  Cada del prpado y la comisura de la boca.  Debilidad en la cara.  Parlisis de la mitad de la cara.  Prdida del gusto.  Sensibilidad a los ruidos fuertes.  Dificultad para Engineer, manufacturing systems.  Lagrimeo del ojo afectado.  Sequedad del ojo afectado.  Babeo.  Dolor detrs de [employer]. DIAGNSTICO  El diagnstico de la parlisis facial puede incluir:  Un examen fsico y Ardelia Mems historia clnica.  Una resonancia magntica.  Una tomografa computarizada.  Electromiograma (EMG). Esta prueba se realiza para controlar el funcionamiento de los nervios. TRATAMIENTO  El tratamiento puede incluir medicamentos antivirales para ayudar a reducir la duracin de Engineer, manufacturing systems. En ocasiones, el tratamiento no es necesario y los sntomas desaparecen por s solos. [place] los medicamentos solamente como se lo haya indicado el mdico.  Hgase masajes y realice los ejercicios faciales, como  se lo haya indicado el mdico.  Si el ojo est afectado: ? Use gotas oftlmicas con efecto hidratante para prevenir la sequedad del ojo, como se lo haya indicado el mdico. ? Proteja el ojo como se lo haya indicado el mdico. SOLICITE ATENCIN MDICA SI:  Los sntomas no mejoran o empeoran.  Babea.  El ojo est rojo, irritado o le duele. SOLICITE ATENCIN MDICA DE INMEDIATO SI:   Siente otra parte del cuerpo dbil o adormecida.  Tiene dificultad para tragar.  Tiene fiebre adems de los sntomas de la parlisis facial.  Siente dolor en el cuello. ASEGRESE DE QUE:   Comprende estas instrucciones.  Controlar su afeccin.  Recibir ayuda de inmediato si no mejora o si empeora. Esta informacin no tiene Marine scientist el consejo del mdico. Asegrese de hacerle al mdico cualquier pregunta que tenga. Document Released: 02/27/2005 Document Revised: 11/18/2014 Elsevier Interactive Patient Education  2017 [employer].   [employer]  Advocacy/Legal Legal Aid [place]:  [phone]  /  [phone]  [place]:  [phone]  Family Service of the [facility] 24-hr Crisis line:  [phone]  [facility], [place]:  [phone]  [place] (custody):  [phone]  [facility]:   [phone]    Baby & Breastfeeding Car Seat Inspection @ Various [place].- call [place] Lactation  [phone]  [facility] Lactation [phone]  [place]: [phone] ([place]);  [phone] ([place])  [place] League:  [phone]   Childcare [place] Child Development: [phone] ([place]) / [phone] ([place])  -  Child Care Resources/ Referrals/ Scholarships  - Head Start/ Early Head Start (call or apply online)  [place] DHHS: [place] Pre-K :  [phone] / [phone]   Employment / [place]: [phone] / [place] ([place]): [phone] ([place]) /  [phone] ([place])  [place]: [phone] / [phone]  [facility]: [phone]  DHHS Work First: [phone] ([place]) / [phone] ([place])  [place]:  [place]:  [phone]  [employer]: [place] (furniture):  [place] Helping Hands: [phone]  [place]  Wise- SNAP/ Food Stamps: [phone]  [place]: Letta [phone] ;  [phone]  [place]  During the summer, text "FOOD" to [place] / Clinics (Adults) [place] (for Adults) through [facility]: [phone]  [place]:   [place]:   [phone]  Health Department:  [place]:  [phone] / [phone]  [facility] [place]:   [phone]  [facility]:   [phone] x [place]:   [place]:  [place]:  [place] for [facility]):  [place]:  [place]:  [place]:  [place]:  [place]  www.youthsafegso.org  PFLAG  384-665-9935 / info@[place] .org  [place]:  [phone]   Mental Health/ Substance Use Family Service of the [place]  [place]:  [phone] or [phone]  [facility] of Care:  [phone]  Journeys Counseling:  [place]:  [phone]  [phone] (walk-ins)  [phone] / [place]:  [address]  Alcoholics Anonymous:  [phone]  Narcotics Anonymous:  [phone]  Quit Smoking Hotline:  800-QUIT-NOW 5732420997)   Parenting [place]:  [place]:  [phone]  YWCA: [phone]  [employer]: Bringing Out the Best:  [phone]               Thriving at Three (Hispanic families): [phone]  Healthy Start ([place]):  [phone]  Parents as Teachers:  [place] Together (Immigrants): [phone]   Poison Control [phone]  [place] Open Doors Application: [address]  [place] of [place]: http://www.[place].gov/index.aspx?page=3615   Special Needs Family Support Network:  [phone]  [place] of [place]:   [place] or [phone] /  [place]:  [phone]  [place]:  [place] (CDSA):  [phone]  [facility] (Care Coordination for Children):  [phone]   Transportation Medicaid Transportation: [phone] to apply  [place]: [phone] (reduced-fare bus ID to [place])  SCAT Paratransit services: Eligible riders only, call [address] for application   Tutoring/Mentoring [place]: [place]: [phone] ([place])  ACES through child's school: [place]: contact your local [place] Program: [phone]

## 2017-04-05 LAB — H. PYLORI BREATH TEST: H pylori Breath Test: NEGATIVE

## 2017-04-06 ENCOUNTER — Telehealth (INDEPENDENT_AMBULATORY_CARE_PROVIDER_SITE_OTHER): Payer: Self-pay

## 2017-04-06 NOTE — Telephone Encounter (Signed)
-----   Message from Clent Demark, PA-C sent at 04/06/2017  1:40 PM EST ----- Negative for H pylori.

## 2017-04-06 NOTE — Telephone Encounter (Signed)
Patient aware of negative H pylori. Nat Christen, CMA

## 2017-05-02 ENCOUNTER — Encounter (INDEPENDENT_AMBULATORY_CARE_PROVIDER_SITE_OTHER): Payer: Self-pay | Admitting: Physician Assistant

## 2017-05-02 ENCOUNTER — Ambulatory Visit (INDEPENDENT_AMBULATORY_CARE_PROVIDER_SITE_OTHER): Payer: Medicaid Other | Admitting: Physician Assistant

## 2017-05-02 VITALS — BP 94/54 | HR 69 | Temp 98.1°F | Resp 18 | Ht 67.0 in | Wt 151.0 lb

## 2017-05-02 DIAGNOSIS — J4540 Moderate persistent asthma, uncomplicated: Secondary | ICD-10-CM | POA: Diagnosis not present

## 2017-05-02 DIAGNOSIS — F418 Other specified anxiety disorders: Secondary | ICD-10-CM

## 2017-05-02 DIAGNOSIS — G43801 Other migraine, not intractable, with status migrainosus: Secondary | ICD-10-CM

## 2017-05-02 MED ORDER — ALBUTEROL SULFATE HFA 108 (90 BASE) MCG/ACT IN AERS: 2.0000 | INHALATION_SPRAY | RESPIRATORY_TRACT | 11 refills | Status: DC | PRN

## 2017-05-02 MED ORDER — MONTELUKAST SODIUM 10 MG PO TABS: 10.0000 mg | ORAL_TABLET | Freq: Every day | ORAL | 11 refills | Status: DC

## 2017-05-02 MED ORDER — SUMATRIPTAN SUCCINATE 25 MG PO TABS: 25.0000 mg | ORAL_TABLET | Freq: Every day | ORAL | 0 refills | Status: DC

## 2017-05-02 MED ORDER — TOPIRAMATE 25 MG PO TABS: 25.0000 mg | ORAL_TABLET | Freq: Every day | ORAL | 5 refills | Status: DC

## 2017-05-02 MED ORDER — BUDESONIDE-FORMOTEROL FUMARATE 160-4.5 MCG/ACT IN AERO: 2.0000 | INHALATION_SPRAY | Freq: Two times a day (BID) | RESPIRATORY_TRACT | 11 refills | Status: DC

## 2017-05-02 MED ORDER — CLONAZEPAM 0.5 MG PO TABS: 0.5000 mg | ORAL_TABLET | Freq: Every day | ORAL | 0 refills | Status: DC

## 2017-05-02 NOTE — Progress Notes (Signed)
Subjective:  Patient ID: Derek Smith, male    DOB: [birth year]  Age: 47 y.o. MRN: [id number]  CC: anxiety  HPI Derek Smith a 47 y.o.malewith a medical history of anxiety, asthma (LBP w/sciatica, H pylori infection, and syncope presents on f/u of anxiety. Has been taking clonazepam and escitalopram as directed. Feeling moderately better. Only complains of left sided headache associated with nausea and photophobia. Relieved with rest in a dark place and sometimes with NSAIDs.     Patient also concerned about his asthma. Says he is using albuterol several times per day. Would like a prescription for a nebulizer. Used Flovent Diskus before with better control of asthma but has since run out. Has never used a combination ICS/LABA. Does not reports any other symptom or complaint.    Outpatient Medications Prior to Visit  Medication Sig Dispense Refill  . acetaminophen (TYLENOL) 500 MG tablet Take 1,000 mg by mouth daily as needed for moderate pain or headache.    . albuterol (PROVENTIL HFA;VENTOLIN HFA) 108 (90 Base) MCG/ACT inhaler Inhale 2 puffs into the lungs every 4 (four) hours as needed for wheezing or shortness of breath. 1 Inhaler 2  . aspirin EC 81 MG tablet Take 81 mg by mouth every evening.    . clonazePAM (KLONOPIN) 0.5 MG tablet Take 1 tablet (0.5 mg total) by mouth at bedtime. 30 tablet 0  . cyclobenzaprine (FLEXERIL) 5 MG tablet Take 1 tablet (5 mg total) by mouth at bedtime. 30 tablet 0  . escitalopram (LEXAPRO) 20 MG tablet Take 1 tablet (20 mg total) by mouth daily. 90 tablet 1  . gabapentin (NEURONTIN) 300 MG capsule Take 2 capsules (600 mg total) by mouth 3 (three) times daily. 180 capsule 3  . hydroxypropyl methylcellulose / hypromellose (ISOPTO TEARS / GONIOVISC) 2.5 % ophthalmic solution Place 1 drop into the left eye 4 (four) times daily. 15 mL 12  . omeprazole (PRILOSEC) 40 MG capsule Take 1 capsule (40 mg total) by mouth daily. 30 capsule 3  . ranitidine  (ZANTAC) 150 MG tablet Take 150 mg by mouth daily as needed for heartburn.    . nitroGLYCERIN (NITROSTAT) 0.4 MG SL tablet Place 1 tablet (0.4 mg total) under the tongue every 5 (five) minutes as needed for chest pain. 90 tablet 3  . Fluticasone Propionate, Inhal, (FLOVENT DISKUS) 250 MCG/BLIST AEPB Inhale 1 Inhaler into the lungs 2 (two) times daily. (Patient not taking: Reported on 10/04/2016) 30 each 2  . predniSONE (DELTASONE) 10 MG tablet Take 1 tablet (10 mg total) by mouth daily with breakfast. Day 1 take 6 tablets  Day 2 take 6 tablets Day 3 take 5 tablets  Day 4 take 5 tablets   Day 5 take 4 tablets  Day 6 take 4 tablets  Day 7 take 3 tablets   Day 8 take 3 tablets  Day 9 take 2 tablets Day 10 take 2 tablets  Day 11 take 1 tablet   Day 12 take 1 tablet 42 tablet 0   No facility-administered medications prior to visit.      ROS Review of Systems  Constitutional: Negative for chills, fever and malaise/fatigue.  Eyes: Negative for blurred vision.  Respiratory: Positive for shortness of breath and wheezing.   Cardiovascular: Negative for chest pain and palpitations.  Gastrointestinal: Negative for abdominal pain and nausea.  Genitourinary: Negative for dysuria and hematuria.  Musculoskeletal: Negative for joint pain and myalgias.  Skin: Negative for rash.  Neurological: Negative for tingling and headaches.  Psychiatric/Behavioral: Negative for depression. The patient is not nervous/anxious.     Objective:  BP (!) 94/54 (BP Location: Left Arm, Patient Position: Sitting, Cuff Size: Normal)   Pulse 69   Temp 98.1 F (36.7 C) (Oral)   Resp 18   Ht 5\' 7"  (1.702 m)   Wt 151 lb (68.5 kg)   SpO2 98%   BMI 23.65 kg/m   BP/Weight 05/02/2017 04/04/2017 2/59/5638  Systolic BP 94 756 433  Diastolic BP 54 74 70  Wt. (Lbs) 151 149.4 145  BMI 23.65 23.4 22.71      Physical Exam  Constitutional: He is oriented to person, place, and time.  Well developed, well nourished, NAD, polite   HENT:  Head: Normocephalic and atraumatic.  Eyes: Conjunctivae and EOM are normal. Pupils are equal, round, and reactive to light. No scleral icterus.  Neck: Normal range of motion. Neck supple. No thyromegaly present.  Cardiovascular: Normal rate, regular rhythm and normal heart sounds.  Pulmonary/Chest: Effort normal and breath sounds normal. No respiratory distress. He has no wheezes. He has no rales.  Musculoskeletal: He exhibits no edema.  Neurological: He is alert and oriented to person, place, and time. No cranial nerve deficit.  Normal gait and coordination.   Skin: Skin is warm and dry. No rash noted. No erythema. No pallor.  Psychiatric: His behavior is normal. Thought content normal.  Patient seems more calm and emotionally stable compared to previous visit. Somewhat constricted affect.  Vitals reviewed.    Assessment & Plan:    1. Moderate persistent asthma, unspecified whether complicated - Begin  budesonide-formoterol (SYMBICORT) 160-4.5 MCG/ACT inhaler; Inhale 2 puffs into the lungs 2 (two) times daily.  Dispense: 1 Inhaler; Refill: 11 - Refill albuterol (PROVENTIL HFA;VENTOLIN HFA) 108 (90 Base) MCG/ACT inhaler; Inhale 2 puffs into the lungs every 4 (four) hours as needed for wheezing or shortness of breath.  Dispense: 1 Inhaler; Refill: 11 - Begin montelukast (SINGULAIR) 10 MG tablet; Take 1 tablet (10 mg total) by mouth at bedtime.  Dispense: 30 tablet; Refill: 11  2. Other migraine with status migrainosus, not intractable - Begin topiramate (TOPAMAX) 25 MG tablet; Take 1 tablet (25 mg total) by mouth at bedtime.  Dispense: 30 tablet; Refill: 5  3. Depression with anxiety - Refill clonazePAM (KLONOPIN) 0.5 MG tablet; Take 1 tablet (0.5 mg total) by mouth at bedtime.  Dispense: 30 tablet; Refill: 0 - Continue on Escitalopram    Meds ordered this encounter  Medications  . budesonide-formoterol (SYMBICORT) 160-4.5 MCG/ACT inhaler    Sig: Inhale 2 puffs into the  lungs 2 (two) times daily.    Dispense:  1 Inhaler    Refill:  11    Order Specific Question:   Supervising Provider    Answer:   Tresa Garter [id number]  . albuterol (PROVENTIL HFA;VENTOLIN HFA) 108 (90 Base) MCG/ACT inhaler    Sig: Inhale 2 puffs into the lungs every 4 (four) hours as needed for wheezing or shortness of breath.    Dispense:  1 Inhaler    Refill:  11    Order Specific Question:   Supervising Provider    Answer:   Tresa Garter [id number]  . montelukast (SINGULAIR) 10 MG tablet    Sig: Take 1 tablet (10 mg total) by mouth at bedtime.    Dispense:  30 tablet    Refill:  11    Order Specific Question:   Supervising Provider    Answer:   Tresa Garter [  [id number]]  . topiramate (TOPAMAX) 25 MG tablet    Sig: Take 1 tablet (25 mg total) by mouth at bedtime.    Dispense:  30 tablet    Refill:  5    Order Specific Question:   Supervising Provider    Answer:   Tresa Garter [id number]  . SUMAtriptan (IMITREX) 25 MG tablet    Sig: Take 1 tablet (25 mg total) by mouth daily. May take one more tablet two hours after the first. No more than two tablets per day.    Dispense:  10 tablet    Refill:  0    Order Specific Question:   Supervising Provider    Answer:   Tresa Garter [id number]  . clonazePAM (KLONOPIN) 0.5 MG tablet    Sig: Take 1 tablet (0.5 mg total) by mouth at bedtime.    Dispense:  30 tablet    Refill:  0    Order Specific Question:   Supervising Provider    Answer:   Tresa Garter [id number]    Follow-up: Return in about 4 weeks (around 05/30/2017) for migraine.   Clent Demark PA

## 2017-05-02 NOTE — Patient Instructions (Signed)
Cefalea migraosa  Migraine Headache  Una cefalea migraosa es un dolor muy intenso y punzante en uno o ambos lados de la cabeza. Las migraas tambin pueden causar otros sntomas. Hable con su mdico sobre los factores que pueden causar (desencadenar) las cefaleas migraosas.  Siga estas indicaciones en su casa:  Medicamentos   Tome los medicamentos de venta libre y los recetados solamente como se lo haya indicado el mdico.   No conduzca ni use maquinaria pesada mientras toma analgsicos recetados.   A fin de prevenir o tratar el estreimiento mientras toma analgsicos recetados, el mdico puede recomendarle lo siguiente:  ? Beber suficiente lquido para mantener el pis (orina) claro o de color amarillo plido.  ? Tomar medicamentos recetados o de venta libre.  ? [place] alimentos ricos en fibra. Entre ellos, frutas y verduras frescas, cereales integrales y frijoles.  ? Limitar los alimentos con alto contenido de grasas y azcares procesados. Estos incluyen alimentos fritos y dulces.  Estilo de vida   Evite el alcohol.   No consuma ningn producto que contenga nicotina o tabaco, como cigarrillos y cigarrillos electrnicos. Si necesita ayuda para dejar de fumar, consulte al mdico.   Duerma como mnimo 8horas todas las noches.   Evite las situaciones de estrs.  Instrucciones generales     Lleve un registro diario para averiguar lo que puede provocar la migraa. Por ejemplo, escriba:  ? Lo que usted come y bebe.  ? Cunto tiempo duerme.  ? Algn cambio en lo que come o bebe.  ? Algn cambio en sus medicamentos.   Si tiene una migraa:  ? Evite los factores que empeoren los sntomas, como las luces brillantes.  ? Resulta til acostarse en una habitacin oscura y silenciosa.  ? No conduzca vehculos ni opere maquinaria pesada.  ? Pregntele al mdico qu actividades son seguras para usted.   Concurra a todas las visitas de control como se lo haya indicado el mdico. Esto es importante.  Comunquese con un  mdico si:   Tiene una migraa que es diferente o peor de sus migraas habituales.  Solicite ayuda de inmediato si:   La migraa empeora mucho.   Tiene fiebre.   Presenta rigidez en el cuello.   Tiene dificultad para ver.   Siente debilidad en los msculos o que no puede controlarlos.   Comienza a perder el equilibrio continuamente.   Comienza a tener dificultad para caminar.   Pierde el conocimiento (se desmaya).  Esta informacin no tiene como fin reemplazar el consejo del mdico. Asegrese de hacerle al mdico cualquier pregunta que tenga.  Document Released: 05/26/2008 Document Revised: 06/06/2016 Document Reviewed: 08/16/2015  Elsevier Interactive Patient Education  2018 Elsevier Inc.

## 2017-05-03 ENCOUNTER — Other Ambulatory Visit (INDEPENDENT_AMBULATORY_CARE_PROVIDER_SITE_OTHER): Payer: Self-pay | Admitting: *Deleted

## 2017-05-03 DIAGNOSIS — J4540 Moderate persistent asthma, uncomplicated: Secondary | ICD-10-CM

## 2017-05-03 DIAGNOSIS — G43801 Other migraine, not intractable, with status migrainosus: Secondary | ICD-10-CM

## 2017-05-03 MED ORDER — TOPIRAMATE 25 MG PO TABS: 25.0000 mg | ORAL_TABLET | Freq: Every day | ORAL | 5 refills | Status: DC

## 2017-05-03 MED ORDER — BUDESONIDE-FORMOTEROL FUMARATE 160-4.5 MCG/ACT IN AERO: 2.0000 | INHALATION_SPRAY | Freq: Two times a day (BID) | RESPIRATORY_TRACT | 11 refills | Status: DC

## 2017-05-03 MED ORDER — MONTELUKAST SODIUM 10 MG PO TABS: 10.0000 mg | ORAL_TABLET | Freq: Every day | ORAL | 11 refills | Status: DC

## 2017-05-03 MED ORDER — SUMATRIPTAN SUCCINATE 25 MG PO TABS: 25.0000 mg | ORAL_TABLET | Freq: Every day | ORAL | 0 refills | Status: DC

## 2017-05-03 MED ORDER — ALBUTEROL SULFATE HFA 108 (90 BASE) MCG/ACT IN AERS: 2.0000 | INHALATION_SPRAY | RESPIRATORY_TRACT | 11 refills | Status: DC | PRN

## 2017-05-03 NOTE — Telephone Encounter (Signed)
Resent to pharmacy [facility]

## 2017-05-21 ENCOUNTER — Ambulatory Visit (INDEPENDENT_AMBULATORY_CARE_PROVIDER_SITE_OTHER): Payer: Medicaid Other | Admitting: Physician Assistant

## 2017-05-25 ENCOUNTER — Other Ambulatory Visit (INDEPENDENT_AMBULATORY_CARE_PROVIDER_SITE_OTHER): Payer: Self-pay | Admitting: Physician Assistant

## 2017-05-25 DIAGNOSIS — J4540 Moderate persistent asthma, uncomplicated: Secondary | ICD-10-CM

## 2017-05-25 MED ORDER — BUDESONIDE-FORMOTEROL FUMARATE 160-4.5 MCG/ACT IN AERO: 2.0000 | INHALATION_SPRAY | Freq: Two times a day (BID) | RESPIRATORY_TRACT | 11 refills | Status: DC

## 2017-05-30 ENCOUNTER — Telehealth: Payer: Self-pay | Admitting: Neurology

## 2017-05-30 ENCOUNTER — Encounter: Payer: Self-pay | Admitting: Neurology

## 2017-05-30 ENCOUNTER — Ambulatory Visit: Payer: Self-pay | Admitting: Neurology

## 2017-05-30 NOTE — Telephone Encounter (Signed)
This patient did not show for a new patient appointment today. 

## 2017-06-04 ENCOUNTER — Ambulatory Visit (INDEPENDENT_AMBULATORY_CARE_PROVIDER_SITE_OTHER): Payer: Medicaid Other | Admitting: Physician Assistant

## 2017-06-20 ENCOUNTER — Ambulatory Visit (INDEPENDENT_AMBULATORY_CARE_PROVIDER_SITE_OTHER): Payer: Medicaid Other | Admitting: Physician Assistant

## 2017-07-09 ENCOUNTER — Ambulatory Visit (INDEPENDENT_AMBULATORY_CARE_PROVIDER_SITE_OTHER): Payer: Medicaid Other | Admitting: Physician Assistant

## 2017-07-10 ENCOUNTER — Telehealth (INDEPENDENT_AMBULATORY_CARE_PROVIDER_SITE_OTHER): Payer: Self-pay | Admitting: Physician Assistant

## 2017-07-10 NOTE — Telephone Encounter (Signed)
Needs evaluation. Medications are not just prescribed for an unknown cause of symptoms.

## 2017-07-10 NOTE — Telephone Encounter (Signed)
Pt called to request medications to treat a sore throat. He could not make his appointment yesterday due to his insurance not being active,Please follow up. -Send medications to [facility]

## 2017-09-21 ENCOUNTER — Encounter (HOSPITAL_COMMUNITY): Payer: Self-pay | Admitting: Emergency Medicine

## 2017-09-21 ENCOUNTER — Emergency Department (HOSPITAL_COMMUNITY)
Admission: EM | Admit: 2017-09-21 | Discharge: 2017-09-21 | Disposition: A | Discharge status: Home / Self Care | Payer: Medicaid Other | Attending: Emergency Medicine | Admitting: Emergency Medicine

## 2017-09-21 ENCOUNTER — Other Ambulatory Visit: Payer: Self-pay

## 2017-09-21 DIAGNOSIS — F419 Anxiety disorder, unspecified: Secondary | ICD-10-CM | POA: Insufficient documentation

## 2017-09-21 DIAGNOSIS — R55 Syncope and collapse: Secondary | ICD-10-CM | POA: Insufficient documentation

## 2017-09-21 DIAGNOSIS — Z79899 Other long term (current) drug therapy: Secondary | ICD-10-CM | POA: Insufficient documentation

## 2017-09-21 DIAGNOSIS — Z733 Stress, not elsewhere classified: Secondary | ICD-10-CM | POA: Insufficient documentation

## 2017-09-21 DIAGNOSIS — Z7982 Long term (current) use of aspirin: Secondary | ICD-10-CM | POA: Insufficient documentation

## 2017-09-21 DIAGNOSIS — R202 Paresthesia of skin: Secondary | ICD-10-CM | POA: Insufficient documentation

## 2017-09-21 LAB — I-STAT CHEM 8, ED
BUN: 13 mg/dL (ref 6–20)
CHLORIDE: 104 mmol/L (ref 98–111)
CREATININE: 1 mg/dL (ref 0.61–1.24)
Calcium, Ion: 1.23 mmol/L (ref 1.15–1.40)
GLUCOSE: 108 mg/dL — AB (ref 70–99)
HCT: 43 % (ref 39.0–52.0)
Hemoglobin: 14.6 g/dL (ref 13.0–17.0)
Potassium: 3.6 mmol/L (ref 3.5–5.1)
Sodium: 141 mmol/L (ref 135–145)
TCO2: 24 mmol/L (ref 22–32)

## 2017-09-21 NOTE — ED Triage Notes (Addendum)
Per EMS, patient from work, found out mother unexpectedly died today, patient initially cried and had syncopal episode. Patient regained consciousness and is in catatonic state at this time.   Attempted to use interpreter to complete triage. Patient initially hysterically crying then has sudden onset of catatonic state in which he is unresponsive to voice. Patient eyes closed.

## 2017-09-21 NOTE — ED Notes (Signed)
Pt is alert with eye opening response. Pt is tearful and has not been verbal at this time. Pt need cuing to perform tasks such as standing, walking. Pt is spanish speaking.

## 2017-09-21 NOTE — ED Provider Notes (Signed)
[facility] DEPT Provider Note   CSN: [id number] Arrival date & time: 09/21/17  1233     History   Chief Complaint Chief Complaint  Patient presents with  . Loss of Consciousness    HPI Burak Zerbe is a 47 y.o. male.  The history is provided by the patient. A language interpreter was used.  Loss of Consciousness       47 year old male with history of recurrent syncope presenting for evaluation of syncope.  Patient brought here via EMS from his workplace.  Leg which interpreter was used for translation.  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  Patient initially was crying and subsequently had a syncopal episode after he regained his consciousness, it was reported that he was in a catatonic state.  EMS brought patient here.  Patient now back at his baseline.  Was found to be crying initially, and was unresponsive to voice for a transient amount of time.  Currently he is complaining of pins-and-needles sensation to his extremities, but denies any significant pain or injuries.  He did report having similar episodes of syncope from stressful event.  Record shows he had a cardiac heart catheterization in 2017 with normal coronary arteries.  He is not a smoker or drinker.  Does have underlying history of anxiety.  Past Medical History:  Diagnosis Date  . Anxiety   . 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 Syncope 03/28/2016    Patient Active Problem List   Diagnosis Date Noted  . Precordial chest pain   . Syncope 03/28/2016    Past Surgical History:  Procedure Laterality Date  . LEFT HEART CATH AND CORONARY ANGIOGRAPHY N/A 05/22/2016   Procedure: Left Heart Cath and Coronary Angiography;  Surgeon: Jettie Booze, MD;  Location: [facility];  Service: Cardiovascular;  Laterality: N/A;  . NO PAST SURGERIES           Home Medications    Prior to Admission medications   Medication Sig Start Date End Date Taking? Authorizing Provider  acetaminophen (TYLENOL) 500 MG tablet Take 1,000 mg by mouth daily as needed for moderate pain or headache.    [provider]  albuterol (PROVENTIL HFA;VENTOLIN HFA) 108 (90 Base) MCG/ACT inhaler Inhale 2 puffs into the lungs every 4 (four) hours as needed for wheezing or shortness of breath. 05/03/17   Clent Demark, PA-C  aspirin EC 81 MG tablet Take 81 mg by mouth every evening.    [provider]  budesonide-formoterol (SYMBICORT) 160-4.5 MCG/ACT inhaler Inhale 2 puffs into the lungs 2 (two) times daily. 05/25/17   Clent Demark, PA-C  clonazePAM (KLONOPIN) 0.5 MG tablet Take 1 tablet (0.5 mg total) by mouth at bedtime. 05/02/17   Clent Demark, PA-C  cyclobenzaprine (FLEXERIL) 5 MG tablet Take 1 tablet (5 mg total) by mouth at bedtime. 11/01/16   Clent Demark, PA-C  escitalopram (LEXAPRO) 20 MG tablet Take 1 tablet (20 mg total) by mouth daily. 04/04/17   Clent Demark, PA-C  gabapentin (NEURONTIN) 300 MG capsule Take 2 capsules (600 mg total) by mouth 3 (three) times daily. 04/04/17   Clent Demark, PA-C  hydroxypropyl methylcellulose / hypromellose (ISOPTO TEARS / GONIOVISC) 2.5 % ophthalmic solution Place 1 drop into the left eye 4 (four) times daily. 03/22/17   Jola Schmidt, MD  montelukast (SINGULAIR) 10 MG tablet Take 1 tablet (10 mg total) by mouth at bedtime. 05/03/17   Clent Demark, PA-C  nitroGLYCERIN (NITROSTAT) 0.4 MG SL tablet Place 1 tablet (0.4 mg total) under the tongue every 5 (five) minutes as needed for chest pain. 05/18/16 08/16/16  Jettie Booze, MD  omeprazole (PRILOSEC) 40 MG capsule Take 1 capsule (40 mg total) by mouth daily. 11/01/16   Clent Demark, PA-C  ranitidine (ZANTAC) 150 MG tablet Take 150 mg by mouth daily as needed for heartburn.    [provider]  SUMAtriptan  (IMITREX) 25 MG tablet Take 1 tablet (25 mg total) by mouth daily. May take one more tablet two hours after the first. No more than two tablets per day. 05/03/17   Clent Demark, PA-C  topiramate (TOPAMAX) 25 MG tablet Take 1 tablet (25 mg total) by mouth at bedtime. 05/03/17   Clent Demark, PA-C    Family History Family History  Problem Relation Age of Onset  . GI problems Mother     Social History Social History   Tobacco Use  . Smoking status: Never Smoker  . Smokeless tobacco: Never Used  Substance Use Topics  . Alcohol use: No  . Drug use: No     Allergies   Patient has no known allergies.   Review of Systems Review of Systems  Cardiovascular: Positive for syncope.  All other systems reviewed and are negative.    Physical Exam Updated Vital Signs BP 110/66 (BP Location: Right Arm)   Pulse 83   Temp 98.9 F (37.2 C) (Oral)   Resp 16   SpO2 97%   Physical Exam  Constitutional: He is oriented to person, place, and time. He appears well-developed and well-nourished.  Tearful but nontoxic  HENT:  Head: Atraumatic.  Eyes: Conjunctivae are normal.  Neck: Neck supple.  Cardiovascular: Normal rate and regular rhythm.  Pulmonary/Chest: Effort normal and breath sounds normal.  Abdominal: Soft. There is no tenderness.  Neurological: He is alert and oriented to person, place, and time.  Skin: No rash noted.  Psychiatric: His speech is normal. He is withdrawn. He exhibits a depressed mood.  Nursing note and vitals reviewed.    ED Treatments / Results  Labs (all labs ordered are listed, but only abnormal results are displayed) Labs Reviewed  I-STAT CHEM 8, ED - Abnormal; Notable for the following components:      Result Value   Glucose, Bld 108 (*)    All other components within normal limits    EKG EKG Interpretation  Date/Time:  Friday September 21 2017 12:44:44 EDT Ventricular Rate:  91 PR Interval:    QRS Duration: 74 QT Interval:  336 QTC  Calculation: 414 R Axis:   72 Text Interpretation:  Sinus rhythm Probable LVH with secondary repol abnrm Confirmed by Quintella Reichert [phone]) on 09/21/2017 1:04:32 PM   Radiology No results found.  Procedures Procedures (including critical care time)  Medications Ordered in ED Medications - No data to display   Initial Impression / Assessment and Plan / ED Course  I have reviewed the triage vital signs and the nursing notes.  Pertinent labs & imaging results that were available during my care of the patient were reviewed by me and considered in my medical decision making (see chart for details).     BP 110/66 (BP Location: Right Arm)   Pulse 83   Temp 98.9 F (37.2 C) (Oral)   Resp 16  SpO2 97%    Final Clinical Impressions(s) / ED Diagnoses   Final diagnoses:  Vasovagal syncope    ED Discharge Orders    None     1:29 PM Patient with an apparent syncopal episode after hearing the news of his mother's unexpected passing.  Suspect condition is likely a vasovagal response.  He is back to his baseline.  He has had similar symptom like this in the past brought on by stress.  He has had a negative heart catheterization done 2 years ago.  He does not have any significant risk factor for PE.  EKG without concerning arrhythmia.  2:33 PM Labs are reassuring, patient felt better.  Family member is at bedside.  Patient stable for discharge.  Recommend follow-up with PCP for further care.  Return precautions discussed.   Domenic Moras, PA-C 09/21/17 1435    Quintella Reichert, MD 09/21/17 1626

## 2017-10-24 ENCOUNTER — Ambulatory Visit: Payer: Self-pay | Attending: Family Medicine

## 2017-11-22 ENCOUNTER — Encounter (INDEPENDENT_AMBULATORY_CARE_PROVIDER_SITE_OTHER): Payer: Self-pay | Admitting: Physician Assistant

## 2017-11-22 ENCOUNTER — Other Ambulatory Visit: Payer: Self-pay

## 2017-11-22 ENCOUNTER — Ambulatory Visit (INDEPENDENT_AMBULATORY_CARE_PROVIDER_SITE_OTHER): Payer: Self-pay | Admitting: Physician Assistant

## 2017-11-22 VITALS — BP 115/76 | HR 77 | Temp 98.2°F | Ht 67.0 in | Wt 154.8 lb

## 2017-11-22 DIAGNOSIS — Z9109 Other allergy status, other than to drugs and biological substances: Secondary | ICD-10-CM

## 2017-11-22 DIAGNOSIS — R21 Rash and other nonspecific skin eruption: Secondary | ICD-10-CM

## 2017-11-22 DIAGNOSIS — J3089 Other allergic rhinitis: Secondary | ICD-10-CM

## 2017-11-22 DIAGNOSIS — R0982 Postnasal drip: Secondary | ICD-10-CM

## 2017-11-22 MED ORDER — TRIAMCINOLONE ACETONIDE 0.5 % EX OINT: 1.0000 "application " | TOPICAL_OINTMENT | Freq: Two times a day (BID) | CUTANEOUS | 0 refills | Status: DC

## 2017-11-22 MED ORDER — CETIRIZINE HCL 10 MG PO TABS: 10.0000 mg | ORAL_TABLET | ORAL | 3 refills | Status: DC

## 2017-11-22 MED ORDER — PREDNISONE 10 MG PO TABS
ORAL_TABLET | ORAL | 0 refills | Status: DC
Start: 2017-11-22 — End: 2018-02-21

## 2017-11-22 MED ORDER — PREDNISONE 10 MG PO TABS: 10.0000 mg | ORAL_TABLET | Freq: Every day | ORAL | 0 refills | Status: DC

## 2017-11-22 MED ORDER — HYDROXYZINE HCL 25 MG PO TABS: 25.0000 mg | ORAL_TABLET | Freq: Every day | ORAL | 3 refills | Status: DC

## 2017-11-22 NOTE — Progress Notes (Signed)
Cough developed in June while patient was in [place]  Pt complains itchy throat and watery eyes

## 2017-11-22 NOTE — Patient Instructions (Signed)
Allergies An allergy is when your body reacts to a substance in a way that is not normal. An allergic reaction can happen after you:  Eat something.  Breathe in something.  Touch something.  You can be allergic to:  Things that are only around during certain seasons, like molds and pollens.  Foods.  Drugs.  Insects.  Animal dander.  What are the signs or symptoms?  Puffiness (swelling). This may happen on the lips, face, tongue, mouth, or throat.  Sneezing.  Coughing.  Breathing loudly (wheezing).  Stuffy nose.  Tingling in the mouth.  A rash.  Itching.  Itchy, red, puffy areas of skin (hives).  Watery eyes.  Throwing up (vomiting).  Watery poop (diarrhea).  Dizziness.  Feeling faint or fainting.  Trouble breathing or swallowing.  A tight feeling in the chest.  A fast heartbeat. How is this diagnosed? Allergies can be diagnosed with:  A medical and family history.  Skin tests.  Blood tests.  A food diary. A food diary is a record of all the foods, drinks, and symptoms you have each day.  The results of an elimination diet. This diet involves making sure not to eat certain foods and then seeing what happens when you start eating them again.  How is this treated? There is no cure for allergies, but allergic reactions can be treated with medicine. Severe reactions usually need to be treated at a hospital. How is this prevented? The best way to prevent an allergic reaction is to avoid the thing you are allergic to. Allergy shots and medicines can also help prevent reactions in some cases. This information is not intended to replace advice given to you by your health care provider. Make sure you discuss any questions you have with your health care provider. Document Released: 06/24/2012 Document Revised: 10/25/2015 Document Reviewed: 12/09/2013 Elsevier Interactive Patient Education  2018 Elsevier Inc.  

## 2017-11-22 NOTE — Progress Notes (Signed)
Subjective:  Patient ID: Derek Smith, male    DOB: 20-Sep-1970  Age: 47 y.o. MRN: [id number]  CC: cough   HPI Derek Smith a 47 y.o.malewith a medical history of anxiety, asthma (LBP w/sciatica,H pylori infection,and syncope presents with three month history of cough, sneezing, watery eyes, nasal congestion, and itchy throat. Reports good air quality at home. Also has a small rash in the left forearm after clearing bushes. Itching was worse and is somewhat better now. Does not endorse swelling, throat closure, abdominal pain, f/c/n/v, HA, CP, palpitations, SOB, or GI/GU sxs.      Outpatient Medications Prior to Visit  Medication Sig Dispense Refill  . albuterol (PROVENTIL HFA;VENTOLIN HFA) 108 (90 Base) MCG/ACT inhaler Inhale 2 puffs into the lungs every 4 (four) hours as needed for wheezing or shortness of breath. 1 Inhaler 11  . aspirin EC 81 MG tablet Take 81 mg by mouth every evening.    . budesonide-formoterol (SYMBICORT) 160-4.5 MCG/ACT inhaler Inhale 2 puffs into the lungs 2 (two) times daily. 1 Inhaler 11  . escitalopram (LEXAPRO) 20 MG tablet Take 1 tablet (20 mg total) by mouth daily. 90 tablet 1  . gabapentin (NEURONTIN) 300 MG capsule Take 2 capsules (600 mg total) by mouth 3 (three) times daily. 180 capsule 3  . hydroxypropyl methylcellulose / hypromellose (ISOPTO TEARS / GONIOVISC) 2.5 % ophthalmic solution Place 1 drop into the left eye 4 (four) times daily. 15 mL 12  . montelukast (SINGULAIR) 10 MG tablet Take 1 tablet (10 mg total) by mouth at bedtime. 30 tablet 11  . ranitidine (ZANTAC) 150 MG tablet Take 150 mg by mouth daily as needed for heartburn.    . topiramate (TOPAMAX) 25 MG tablet Take 1 tablet (25 mg total) by mouth at bedtime. 30 tablet 5  . acetaminophen (TYLENOL) 500 MG tablet Take 1,000 mg by mouth daily as needed for moderate pain or headache.    . nitroGLYCERIN (NITROSTAT) 0.4 MG SL tablet Place 1 tablet (0.4 mg total) under the tongue  every 5 (five) minutes as needed for chest pain. 90 tablet 3  . omeprazole (PRILOSEC) 40 MG capsule Take 1 capsule (40 mg total) by mouth daily. (Patient not taking: Reported on 11/22/2017) 30 capsule 3  . SUMAtriptan (IMITREX) 25 MG tablet Take 1 tablet (25 mg total) by mouth daily. May take one more tablet two hours after the first. No more than two tablets per day. (Patient not taking: Reported on 11/22/2017) 10 tablet 0  . clonazePAM (KLONOPIN) 0.5 MG tablet Take 1 tablet (0.5 mg total) by mouth at bedtime. 30 tablet 0  . cyclobenzaprine (FLEXERIL) 5 MG tablet Take 1 tablet (5 mg total) by mouth at bedtime. 30 tablet 0   No facility-administered medications prior to visit.      ROS Review of Systems  Constitutional: Negative for chills, fever and malaise/fatigue.  HENT: Positive for congestion. Negative for sinus pain and sore throat.   Eyes: Negative for blurred vision.       Watery eyes  Respiratory: Positive for cough. Negative for shortness of breath and wheezing.   Cardiovascular: Negative for chest pain and palpitations.  Gastrointestinal: Negative for abdominal pain and nausea.  Genitourinary: Negative for dysuria and hematuria.  Musculoskeletal: Negative for joint pain and myalgias.  Skin: Positive for itching and rash.  Neurological: Negative for tingling and headaches.  Psychiatric/Behavioral: Negative for depression. The patient is not nervous/anxious.     Objective:  BP 115/76 (BP Location: Left Arm,  Patient Position: Sitting, Cuff Size: Normal)   Pulse 77   Temp 98.2 F (36.8 C) (Oral)   Ht 5\' 7"  (1.702 m)   Wt 154 lb 12.8 oz (70.2 kg)   SpO2 96%   BMI 24.25 kg/m   BP/Weight 11/22/2017 09/21/2017 6/81/1572  Systolic BP 620 355 94  Diastolic BP 76 73 54  Wt. (Lbs) 154.8 - 151  BMI 24.25 - 23.65      Physical Exam  Constitutional: He is oriented to person, place, and time.  Well developed, well nourished, NAD, polite  HENT:  Head: Normocephalic and  atraumatic.  Turbinates pale and moderately to severely hypertrophic. Postnasal drip. Mildly erythematous TM bilaterally.  Eyes: No scleral icterus.  Neck: Normal range of motion. Neck supple. No thyromegaly present.  Cardiovascular: Normal rate, regular rhythm and normal heart sounds.  Pulmonary/Chest: Effort normal and breath sounds normal.  Musculoskeletal: He exhibits no edema.  Lymphadenopathy:    He has no cervical adenopathy.  Neurological: He is alert and oriented to person, place, and time.  Skin: Skin is warm and dry. No rash noted. No erythema. No pallor.  Few small erythematous papules on ventral aspect of left forearm; Koebner phenomenon noted. No signs of cellulitis.  Psychiatric: He has a normal mood and affect. His behavior is normal. Thought content normal.  Vitals reviewed.    Assessment & Plan:   1. Environmental allergies - Allergens Zone 2 - Begin cetirizine (ZYRTEC) 10 MG tablet; Take 1 tablet (10 mg total) by mouth every morning.  Dispense: 30 tablet; Refill: 3 - Begin hydrOXYzine (ATARAX/VISTARIL) 25 MG tablet; Take 1 tablet (25 mg total) by mouth at bedtime.  Dispense: 30 tablet; Refill: 3 - Begin predniSONE (DELTASONE) 10 MG tablet; Day 1 take 6 tablets  Day 2 take 6 tablets Day 3 take 5 tablets  Day 4 take 5 tablets   Day 5 take 4 tablets  Day 6 take 4 tablets  Day 7 take 3 tablets   Day 8 take 3 tablets  Day 9 take 2 tablets Day 10 take 2 tablets  Day 11 take 1 tablet   Day 12 take 1 tablet  Dispense: 42 tablet; Refill: 0  2. Non-seasonal allergic rhinitis, unspecified trigger - Allergens Zone 2 - cetirizine (ZYRTEC) 10 MG tablet; Take 1 tablet (10 mg total) by mouth every morning.  Dispense: 30 tablet; Refill: 3 - hydrOXYzine (ATARAX/VISTARIL) 25 MG tablet; Take 1 tablet (25 mg total) by mouth at bedtime.  Dispense: 30 tablet; Refill: 3 - predniSONE (DELTASONE) 10 MG tablet; Day 1 take 6 tablets  Day 2 take 6 tablets Day 3 take 5 tablets  Day 4 take 5  tablets   Day 5 take 4 tablets  Day 6 take 4 tablets  Day 7 take 3 tablets   Day 8 take 3 tablets  Day 9 take 2 tablets Day 10 take 2 tablets  Day 11 take 1 tablet   Day 12 take 1 tablet  Dispense: 42 tablet; Refill: 0  3. Postnasal drip - Allergens Zone 2 - cetirizine (ZYRTEC) 10 MG tablet; Take 1 tablet (10 mg total) by mouth every morning.  Dispense: 30 tablet; Refill: 3 - hydrOXYzine (ATARAX/VISTARIL) 25 MG tablet; Take 1 tablet (25 mg total) by mouth at bedtime.  Dispense: 30 tablet; Refill: 3 - predniSONE (DELTASONE) 10 MG tablet; Day 1 take 6 tablets  Day 2 take 6 tablets Day 3 take 5 tablets  Day 4 take 5 tablets   Day  5 take 4 tablets  Day 6 take 4 tablets  Day 7 take 3 tablets   Day 8 take 3 tablets  Day 9 take 2 tablets Day 10 take 2 tablets  Day 11 take 1 tablet   Day 12 take 1 tablet  Dispense: 42 tablet; Refill: 0   Meds ordered this encounter  Medications  . DISCONTD: predniSONE (DELTASONE) 10 MG tablet    Sig: Take 1 tablet (10 mg total) by mouth daily with breakfast. Day 1 take 6 tablets  Day 2 take 6 tablets Day 3 take 5 tablets  Day 4 take 5 tablets   Day 5 take 4 tablets  Day 6 take 4 tablets  Day 7 take 3 tablets   Day 8 take 3 tablets  Day 9 take 2 tablets Day 10 take 2 tablets  Day 11 take 1 tablet   Day 12 take 1 tablet    Dispense:  42 tablet    Refill:  0    Order Specific Question:   Supervising Provider    Answer:   Charlott Rakes [[id number]]  . cetirizine (ZYRTEC) 10 MG tablet    Sig: Take 1 tablet (10 mg total) by mouth every morning.    Dispense:  30 tablet    Refill:  3    Order Specific Question:   Supervising Provider    Answer:   Charlott Rakes [[id number]]  . hydrOXYzine (ATARAX/VISTARIL) 25 MG tablet    Sig: Take 1 tablet (25 mg total) by mouth at bedtime.    Dispense:  30 tablet    Refill:  3    Order Specific Question:   Supervising Provider    Answer:   Charlott Rakes [[id number]]  . predniSONE (DELTASONE) 10 MG tablet    Sig: Day 1 take 6 tablets  Day  2 take 6 tablets Day 3 take 5 tablets  Day 4 take 5 tablets   Day 5 take 4 tablets  Day 6 take 4 tablets  Day 7 take 3 tablets   Day 8 take 3 tablets  Day 9 take 2 tablets Day 10 take 2 tablets  Day 11 take 1 tablet   Day 12 take 1 tablet    Dispense:  42 tablet    Refill:  0    Order Specific Question:   Supervising Provider    Answer:   Charlott Rakes [[id number]]    Follow-up: Return if symptoms worsen or fail to improve.   Clent Demark PA

## 2017-11-23 MED FILL — TRIAMCINOLONE 0.5% OINTMENT: 0.5 | 15 days supply | Qty: 30 | Fill #0

## 2017-11-23 MED FILL — ?CETIRIZINE HCL 10 MG TABLE: 10 | 30 days supply | Qty: 30 | Fill #0

## 2017-11-23 MED FILL — predniSONE 10 MG TABS: 10 | 12 days supply | Qty: 42 | Fill #0

## 2017-11-23 MED FILL — hydrOXYzine HCL 25 MG TABS: 25 | 30 days supply | Qty: 30 | Fill #0

## 2017-11-28 LAB — IGE+ALLERGENS ZONE 2(30)
Alternaria Alternata IgE: <0.1 kU/L
Aspergillus Fumigatus IgE: <0.1 kU/L
Bahia Grass IgE: <0.1 kU/L
Bermuda Grass IgE: 0.16 kU/L — AB
Cedar, Mountain IgE: <0.1 kU/L
Cladosporium Herbarum IgE: <0.1 kU/L
D Pteronyssinus IgE: <0.1 kU/L
Elm, American IgE: 0.11 kU/L — AB
Hickory, White IgE: <0.1 kU/L
IgE (Immunoglobulin E), Serum: 179 IU/mL (ref 6–495)
Johnson Grass IgE: 0.11 kU/L — AB
Maple/Box Elder IgE: <0.1 kU/L
Nettle IgE: <0.1 kU/L
Oak, White IgE: <0.1 kU/L
Penicillium Chrysogen IgE: <0.1 kU/L
Pigweed, Rough IgE: <0.1 kU/L
Plantain, English IgE: 0.13 kU/L — AB
Sheep Sorrel IgE Qn: 0.18 kU/L — AB
W001-IGE RAGWEED, SHORT: 0.12 kU/L — AB

## 2017-11-30 ENCOUNTER — Other Ambulatory Visit (INDEPENDENT_AMBULATORY_CARE_PROVIDER_SITE_OTHER): Payer: Self-pay | Admitting: Physician Assistant

## 2017-11-30 ENCOUNTER — Telehealth (INDEPENDENT_AMBULATORY_CARE_PROVIDER_SITE_OTHER): Payer: Self-pay

## 2017-11-30 DIAGNOSIS — Z9109 Other allergy status, other than to drugs and biological substances: Secondary | ICD-10-CM

## 2017-11-30 NOTE — Telephone Encounter (Signed)
Call placed using pacific interpreter Cassandria Santee [phone]) patient is aware that he has several low level allergies to grass, weeds and trees. He would like to be referred to allergist. Explained to patient that once referral is made someone from the allergist office will call him to schedule his appointment. Patient expressed understanding and did not have any additional questions. Nat Christen, CMA

## 2017-11-30 NOTE — Telephone Encounter (Signed)
-----   Message from Clent Demark, PA-C sent at 11/28/2017  9:38 AM EDT ----- Several low level allergies to grass, weeds, and trees. He may opt to see an allergist for treatment or continue taking antihistamines. Please let me know what he decides.

## 2017-11-30 NOTE — Telephone Encounter (Signed)
Referral made 

## 2018-01-17 ENCOUNTER — Ambulatory Visit: Payer: Self-pay | Admitting: Allergy

## 2018-01-21 ENCOUNTER — Ambulatory Visit (INDEPENDENT_AMBULATORY_CARE_PROVIDER_SITE_OTHER): Payer: Self-pay | Admitting: Physician Assistant

## 2018-01-21 ENCOUNTER — Encounter (INDEPENDENT_AMBULATORY_CARE_PROVIDER_SITE_OTHER): Payer: Self-pay | Admitting: Physician Assistant

## 2018-01-21 ENCOUNTER — Other Ambulatory Visit: Payer: Self-pay

## 2018-01-21 VITALS — BP 108/70 | HR 75 | Temp 97.8°F | Ht 67.0 in | Wt 157.6 lb

## 2018-01-21 DIAGNOSIS — R059 Cough, unspecified: Secondary | ICD-10-CM

## 2018-01-21 DIAGNOSIS — R05 Cough: Secondary | ICD-10-CM

## 2018-01-21 DIAGNOSIS — R490 Dysphonia: Secondary | ICD-10-CM

## 2018-01-21 MED ORDER — PHENYLEPHRINE-DM-GG-APAP 5-10-200-325 MG/10ML PO LIQD: 20.0000 mL | ORAL | 0 refills | Status: AC

## 2018-01-21 MED ORDER — AZITHROMYCIN 250 MG PO TABS: ORAL_TABLET | ORAL | 0 refills | Status: DC

## 2018-01-21 MED ORDER — NAPROXEN 500 MG PO TABS: 500.0000 mg | ORAL_TABLET | Freq: Two times a day (BID) | ORAL | 0 refills | Status: DC

## 2018-01-21 NOTE — Progress Notes (Signed)
Subjective:  Patient ID: Derek Smith, male    DOB: 1970/10/19  Age: 47 y.o. MRN: [id number]  CC: cough and loss of voice  HPI Derek Smith a 47 y.o.malewith a medical history of anxiety,asthma (LBP w/sciatica,H pylori infection,and syncope presents with four day history of cough, malaise, hoarseness, body aches, chest congestion. Has tried OTC Alka Seltzer Cold and Vit C. Wife is sick with similar symptoms. Does not endorse any other symptoms or complaints.      Outpatient Medications Prior to Visit  Medication Sig Dispense Refill  . acetaminophen (TYLENOL) 500 MG tablet Take 1,000 mg by mouth daily as needed for moderate pain or headache.    . albuterol (PROVENTIL HFA;VENTOLIN HFA) 108 (90 Base) MCG/ACT inhaler Inhale 2 puffs into the lungs every 4 (four) hours as needed for wheezing or shortness of breath. 1 Inhaler 11  . aspirin EC 81 MG tablet Take 81 mg by mouth every evening.    . budesonide-formoterol (SYMBICORT) 160-4.5 MCG/ACT inhaler Inhale 2 puffs into the lungs 2 (two) times daily. 1 Inhaler 11  . cetirizine (ZYRTEC) 10 MG tablet Take 1 tablet (10 mg total) by mouth every morning. 30 tablet 3  . escitalopram (LEXAPRO) 20 MG tablet Take 1 tablet (20 mg total) by mouth daily. 90 tablet 1  . gabapentin (NEURONTIN) 300 MG capsule Take 2 capsules (600 mg total) by mouth 3 (three) times daily. 180 capsule 3  . hydroxypropyl methylcellulose / hypromellose (ISOPTO TEARS / GONIOVISC) 2.5 % ophthalmic solution Place 1 drop into the left eye 4 (four) times daily. 15 mL 12  . hydrOXYzine (ATARAX/VISTARIL) 25 MG tablet Take 1 tablet (25 mg total) by mouth at bedtime. 30 tablet 3  . montelukast (SINGULAIR) 10 MG tablet Take 1 tablet (10 mg total) by mouth at bedtime. 30 tablet 11  . nitroGLYCERIN (NITROSTAT) 0.4 MG SL tablet Place 1 tablet (0.4 mg total) under the tongue every 5 (five) minutes as needed for chest pain. 90 tablet 3  . omeprazole (PRILOSEC) 40 MG capsule  Take 1 capsule (40 mg total) by mouth daily. (Patient not taking: Reported on 11/22/2017) 30 capsule 3  . predniSONE (DELTASONE) 10 MG tablet Day 1 take 6 tablets  Day 2 take 6 tablets Day 3 take 5 tablets  Day 4 take 5 tablets   Day 5 take 4 tablets  Day 6 take 4 tablets  Day 7 take 3 tablets   Day 8 take 3 tablets  Day 9 take 2 tablets Day 10 take 2 tablets  Day 11 take 1 tablet   Day 12 take 1 tablet 42 tablet 0  . ranitidine (ZANTAC) 150 MG tablet Take 150 mg by mouth daily as needed for heartburn.    . SUMAtriptan (IMITREX) 25 MG tablet Take 1 tablet (25 mg total) by mouth daily. May take one more tablet two hours after the first. No more than two tablets per day. (Patient not taking: Reported on 11/22/2017) 10 tablet 0  . topiramate (TOPAMAX) 25 MG tablet Take 1 tablet (25 mg total) by mouth at bedtime. 30 tablet 5  . triamcinolone ointment (KENALOG) 0.5 % Apply 1 application topically 2 (two) times daily. 30 g 0   No facility-administered medications prior to visit.      ROS Review of Systems  Constitutional: Positive for malaise/fatigue. Negative for chills and fever.  HENT: Positive for congestion.        Hoarse voice  Eyes: Negative for blurred vision.  Respiratory: Negative for  shortness of breath.   Cardiovascular: Negative for chest pain and palpitations.  Gastrointestinal: Negative for abdominal pain and nausea.  Genitourinary: Negative for dysuria and hematuria.  Musculoskeletal: Positive for joint pain and myalgias.  Skin: Negative for rash.  Neurological: Negative for tingling and headaches.  Psychiatric/Behavioral: Negative for depression. The patient is not nervous/anxious.     Objective:  BP 108/70 (BP Location: Left Arm, Patient Position: Sitting, Cuff Size: Normal)   Pulse 75   Temp 97.8 F (36.6 C) (Oral)   Ht 5\' 7"  (1.702 m)   Wt 157 lb 9.6 oz (71.5 kg)   SpO2 98%   BMI 24.68 kg/m   BP/Weight 01/21/2018 11/22/2017 6/38/1771  Systolic BP 165 790 383   Diastolic BP 70 76 73  Wt. (Lbs) 157.6 154.8 -  BMI 24.68 24.25 -      Physical Exam  Constitutional: He is oriented to person, place, and time.  Well developed, mildly ill, not toxic, hoarse voice, occasional cough, polite  HENT:  Head: Normocephalic and atraumatic.  Mouth/Throat: No oropharyngeal exudate.  TMs pearly gray, bony landmarks visualized, no fluid levels appreciated bilaterally  Eyes: Conjunctivae are normal. Right eye exhibits no discharge. Left eye exhibits no discharge. No scleral icterus.  Neck: Normal range of motion. Neck supple. No thyromegaly present.  Cardiovascular: Normal rate, regular rhythm and normal heart sounds.  Pulmonary/Chest: Effort normal and breath sounds normal. No stridor. No respiratory distress. He has no wheezes. He has no rales.  Musculoskeletal: He exhibits no edema.  Lymphadenopathy:    He has cervical adenopathy.  Neurological: He is alert and oriented to person, place, and time.  Skin: Skin is warm and dry. No rash noted. No erythema. No pallor.  Psychiatric: He has a normal mood and affect. His behavior is normal. Thought content normal.  Vitals reviewed.    Assessment & Plan:    1. Cough - Suspect viral. However, I have instructed patient to take abx should feel worse by day seven of onset of symptoms.  - Phenylephrine-DM-GG-APAP (MUCINEX FAST-MAX COLD FLU) 5-10-200-325 MG/10ML LIQD; Take 20 mLs by mouth every 4 (four) hours for 5 days.  Dispense: 1 Bottle; Refill: 0 - naproxen (NAPROSYN) 500 MG tablet; Take 1 tablet (500 mg total) by mouth 2 (two) times daily with a meal.  Dispense: 30 tablet; Refill: 0 - azithromycin (ZITHROMAX) 250 MG tablet; Take two tablets on day one then one tablet daily thereafter.  Dispense: 6 tablet; Refill: 0  2. Hoarseness of voice - Phenylephrine-DM-GG-APAP (MUCINEX FAST-MAX COLD FLU) 5-10-200-325 MG/10ML LIQD; Take 20 mLs by mouth every 4 (four) hours for 5 days.  Dispense: 1 Bottle; Refill: 0 -  naproxen (NAPROSYN) 500 MG tablet; Take 1 tablet (500 mg total) by mouth 2 (two) times daily with a meal.  Dispense: 30 tablet; Refill: 0 - azithromycin (ZITHROMAX) 250 MG tablet; Take two tablets on day one then one tablet daily thereafter.  Dispense: 6 tablet; Refill: 0   Meds ordered this encounter  Medications  . Phenylephrine-DM-GG-APAP (MUCINEX FAST-MAX COLD FLU) 5-10-200-325 MG/10ML LIQD    Sig: Take 20 mLs by mouth every 4 (four) hours for 5 days.    Dispense:  1 Bottle    Refill:  0    Order Specific Question:   Supervising Provider    Answer:   Charlott Rakes [[id number]]  . naproxen (NAPROSYN) 500 MG tablet    Sig: Take 1 tablet (500 mg total) by mouth 2 (two) times daily with a meal.  Dispense:  30 tablet    Refill:  0    Order Specific Question:   Supervising Provider    Answer:   Charlott Rakes [[id number]]  . azithromycin (ZITHROMAX) 250 MG tablet    Sig: Take two tablets on day one then one tablet daily thereafter.    Dispense:  6 tablet    Refill:  0    Order Specific Question:   Supervising Provider    Answer:   Charlott Rakes [[id number]]    Follow-up: Return if symptoms worsen or fail to improve.   Clent Demark PA

## 2018-01-21 NOTE — Patient Instructions (Signed)
Tos en los adultos  (Cough, Adult)  La tos ayuda a limpiar la garganta y los pulmones. La tos puede durar solo 2 o 3semanas (aguda) o ms de 8semanas (crnica). Las causas de la tos son varias. Puede ser el signo de una enfermedad o de otro trastorno.  CUIDADOS EN EL HOGAR   Est atento a cualquier cambio en la tos.   Tome los medicamentos solamente como se lo haya indicado el mdico.  ? Si le recetaron un antibitico, tmelo como se lo haya indicado el mdico. No deje de tomarlo aunque comience a sentirse mejor.  ? Hable con el mdico antes de probar un medicamento para la tos.   Beba suficiente lquido para mantener el pis (orina) claro o de color amarillo plido.   Si el aire est seco, use un vaporizador o un humidificador con vapor fro en su casa.   Mantngase alejado de las cosas que lo hacen toser en el trabajo o en su casa.   Si la tos aumenta durante la noche, haga la prueba de usar almohadas adicionales para mantener la cabeza ms elevada mientras duerme.   No fume e intente no estar cerca de humo. Si necesita ayuda para dejar de fumar, consulte al mdico.   No consuma cafena.   No beba alcohol.   Descanse todo lo que sea necesario.  SOLICITE AYUDA SI:   Le aparecen problemas (sntomas) nuevos.   Expectora un lquido amarillento (pus) cuando tose.   La tos no mejora despus de 2 o 3semanas, o empeora.   Los medicamentos no le alivian la tos, y no puede dormir bien.   Siente un dolor que se vuelve ms intenso o que no se alivia con los medicamentos.   Tiene fiebre.   Est bajando de peso y no sabe por qu.   Tiene transpiracin nocturna.  SOLICITE AYUDA DE INMEDIATO SI:   Tose y escupe sangre.   Tiene dificultad para respirar.   Los latidos cardacos son muy rpidos.  Esta informacin no tiene como fin reemplazar el consejo del mdico. Asegrese de hacerle al mdico cualquier pregunta que tenga.  Document Released: 11/10/2010 Document Revised: 11/18/2014 Document Reviewed:  05/06/2014  Elsevier Interactive Patient Education  2018 [employer]

## 2018-02-21 ENCOUNTER — Encounter: Payer: Medicaid Other | Admitting: Allergy

## 2018-02-21 ENCOUNTER — Encounter: Payer: Self-pay | Admitting: Allergy

## 2018-02-21 NOTE — Progress Notes (Deleted)
New Patient Note  RE: Derek Smith MRN: [id number] DOB: 1971-02-06 Date of Office Visit: 02/21/2018  Referring provider: Clent Demark, PA-C Primary care provider: Clent Demark, PA-C  Chief Complaint: Cough  History of Present Illness: I had the pleasure of seeing Derek Smith for initial evaluation at the [facility] on 02/21/2018. He is a 47 y.o. male, who is referred here by Clent Demark, PA-C for the evaluation of cough. He is accompanied today by his *** who provided/contributed to the history.   Resp: He reports symptoms of *** chest tightness, shortness of breath, coughing, wheezing, nocturnal awakenings for *** years. Current medications include *** which help. He reports *** using aerochamber with inhalers. He tried the following inhalers: ***. Main triggers are ***allergies, infections, weather changes, smoke, exercise, pet exposure. In the last month, frequency of symptoms: ***x/week. Frequency of nocturnal symptoms: ***x/month. Frequency of SABA use: ***x/week. Interference with physical activity: ***. Sleep is ***disturbed. In the last 12 months, emergency room visits/urgent care visits/doctor office visits or hospitalizations due to respiratory issues: ***. In the last 12 months, oral steroids courses: *** Lifetime history of hospitalization for respiratory issues: ***. Prior intubations: ***. History of pneumonia: ***. He was evaluated by allergist ***pulmonologist in the past. Smoking exposure: ***. Up to date with flu vaccine: ***. Up to date with pneumonia vaccine: ***.   He reports symptoms of ***. Symptoms have been going on for *** years. The symptoms are present *** all year around with worsening in ***. Other triggers include exposure to ***. Anosmia: ***. Headache: ***. No additional trigger factors including exposure to *** strong odors(perfumes/cleaning agents), change in temperature, spicy food and emotional situations. He  has used *** with ***fair improvement in symptoms. Sinus infections: ***. Previous work up includes: 2019 immunocap was borderline positive to grass, weeds. Previous ENT evaluation: ***. Previous sinus imaging: ***.  Assessment and Plan: Derek Smith is a 47 y.o. male with: No problem-specific Assessment & Plan notes found for this encounter.  No follow-ups on file.  No orders of the defined types were placed in this encounter.  Lab Orders  No laboratory test(s) ordered today    Other allergy screening: Asthma: {Blank single:19197::"yes","no"} Rhino conjunctivitis: {Blank single:19197::"yes","no"} Food allergy: {Blank single:19197::"yes","no"} Medication allergy: {Blank single:19197::"yes","no"} Hymenoptera allergy: {Blank single:19197::"yes","no"} Urticaria: {Blank single:19197::"yes","no"} Eczema:{Blank single:19197::"yes","no"} History of recurrent infections suggestive of immunodeficency: {Blank single:19197::"yes","no"}  Diagnostics: Spirometry:  Tracings reviewed. His effort: Good reproducible efforts. FVC: 4.06L FEV1: 3.03L, 80% predicted FEV1/FVC ratio: 75% Interpretation: {Blank single:19197::"Spirometry consistent with mild obstructive disease","Spirometry consistent with moderate obstructive disease","Spirometry consistent with severe obstructive disease","Spirometry consistent with possible restrictive disease","Spirometry consistent with mixed obstructive and restrictive disease","Spirometry uninterpretable due to technique","Spirometry consistent with normal pattern"}.  Please see scanned spirometry results for details.  Skin Testing: {Blank single:19197::"Environmental allergy panel","Food allergy panel","None","Deferred due to recent antihistamines use"}. Positive test to: ***. Negative test to: ***.  Results discussed with patient/family.   Past Medical History: Patient Active Problem List   Diagnosis Date Noted  . Precordial chest pain   . Syncope 03/28/2016    Past Medical History:  Diagnosis Date  . Anxiety   . 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 Syncope 03/28/2016   Past Surgical History: Past Surgical History:  Procedure Laterality Date  . LEFT HEART CATH AND CORONARY ANGIOGRAPHY N/A 05/22/2016   Procedure: Left Heart Cath and Coronary Angiography;  Surgeon: Jettie Booze, MD;  Location: [facility];  Service: Cardiovascular;  Laterality: N/A;  . NO PAST SURGERIES     Medication List:  Current Outpatient Medications  Medication Sig Dispense Refill  . acetaminophen (TYLENOL) 500 MG tablet Take 1,000 mg by mouth daily as needed for moderate pain or headache.    Marland Kitchen aspirin EC 81 MG tablet Take 81 mg by mouth every evening.    . budesonide-formoterol (SYMBICORT) 160-4.5 MCG/ACT inhaler Inhale 2 puffs into the lungs 2 (two) times daily. 1 Inhaler 11  . cetirizine (ZYRTEC) 10 MG tablet Take 1 tablet (10 mg total) by mouth every morning. 30 tablet 3  . gabapentin (NEURONTIN) 300 MG capsule Take 300 mg by mouth 3 (three) times daily.    . hydrOXYzine (ATARAX/VISTARIL) 25 MG tablet Take 1 tablet (25 mg total) by mouth at bedtime. 30 tablet 3  . montelukast (SINGULAIR) 10 MG tablet Take 1 tablet (10 mg total) by mouth at bedtime. 30 tablet 11  . naproxen (NAPROSYN) 500 MG tablet Take 1 tablet (500 mg total) by mouth 2 (two) times daily with a meal. 30 tablet 0  . albuterol (PROVENTIL HFA;VENTOLIN HFA) 108 (90 Base) MCG/ACT inhaler Inhale 2 puffs into the lungs every 4 (four) hours as needed for wheezing or shortness of breath. (Patient not taking: Reported on 02/21/2018) 1 Inhaler 11   No current facility-administered medications for this visit.    Allergies: No Known Allergies Social History: Social History   Socioeconomic History  . Marital status: Married    Spouse name: Not on file  . Number of children: Not on file  . Years of education: Not on file  . Highest education level: Not on file  Occupational  History  . Not on file  Social Needs  . Financial resource strain: Not on file  . Food insecurity:    Worry: Not on file    Inability: Not on file  . Transportation needs:    Medical: Not on file    Non-medical: Not on file  Tobacco Use  . Smoking status: Never Smoker  . Smokeless tobacco: Never Used  Substance and Sexual Activity  . Alcohol use: No  . Drug use: No  . Sexual activity: Yes  Lifestyle  . Physical activity:    Days per week: Not on file    Minutes per session: Not on file  . Stress: Not on file  Relationships  . Social connections:    Talks on phone: Not on file    Gets together: Not on file    Attends religious service: Not on file    Active member of club or organization: Not on file    Attends meetings of clubs or organizations: Not on file    Relationship status: Not on file  Other Topics Concern  . Not on file  Social History Narrative  . Not on file   Lives in a ***. Smoking: *** Occupation: ***  Environmental HistoryFreight forwarder in the house: Estate agent in the family room: {Blank single:19197::"yes","no"} Carpet in the bedroom: {Blank single:19197::"yes","no"} Heating: {Blank single:19197::"electric","gas"} Cooling: {Blank single:19197::"central","window"} Pet: {Blank single:19197::"yes ***","no"}  Family History: Family History  Problem Relation Age of Onset  . GI problems Mother    Problem                               Relation Asthma                                   ***  Eczema                                *** Food allergy                          *** Allergic rhino conjunctivitis     ***  Review of Systems  Constitutional: Negative for appetite change, chills, fever and unexpected weight change.  HENT: Negative for congestion and rhinorrhea.   Eyes: Negative for itching.  Respiratory: Negative for cough, chest tightness, shortness of breath and wheezing.   Cardiovascular: Negative for chest  pain.  Gastrointestinal: Negative for abdominal pain.  Genitourinary: Negative for difficulty urinating.  Skin: Negative for rash.  Allergic/Immunologic: Negative for environmental allergies and food allergies.  Neurological: Negative for headaches.   Objective: BP 106/82 (BP Location: Left Arm, Patient Position: Sitting, Cuff Size: Normal)   Pulse 83   Temp 97.8 F (36.6 C) (Oral)   Resp 18   Ht 5' 7.5" (1.715 m)   Wt 159 lb 3.2 oz (72.2 kg)   SpO2 97%   BMI 24.57 kg/m  Body mass index is 24.57 kg/m. Physical Exam  Constitutional: He is oriented to person, place, and time. He appears well-developed and well-nourished.  HENT:  Head: Normocephalic and atraumatic.  Right Ear: External ear normal.  Left Ear: External ear normal.  Nose: Nose normal.  Mouth/Throat: Oropharynx is clear and moist.  Eyes: Conjunctivae and EOM are normal.  Neck: Neck supple.  Cardiovascular: Normal rate, regular rhythm and normal heart sounds. Exam reveals no gallop and no friction rub.  No murmur heard. Pulmonary/Chest: Effort normal and breath sounds normal. He has no wheezes. He has no rales.  Abdominal: Soft. Bowel sounds are normal. There is no abdominal tenderness.  Lymphadenopathy:    He has no cervical adenopathy.  Neurological: He is alert and oriented to person, place, and time.  Skin: Skin is warm. No rash noted.  Psychiatric: He has a normal mood and affect. His behavior is normal.  Nursing note and vitals reviewed.  The plan was reviewed with the patient/family, and all questions/concerned were addressed.  It was my pleasure to see Derek Smith today and participate in his care. Please feel free to contact me with any questions or concerns.  Sincerely,  Rexene Alberts, DO Allergy & Immunology  [facility] office: [phone] [place]

## 2018-02-22 NOTE — Progress Notes (Signed)
This encounter was created in error - please disregard.

## 2018-02-25 ENCOUNTER — Telehealth: Payer: Self-pay | Admitting: *Deleted

## 2018-02-25 ENCOUNTER — Other Ambulatory Visit (INDEPENDENT_AMBULATORY_CARE_PROVIDER_SITE_OTHER): Payer: Self-pay | Admitting: Physician Assistant

## 2018-02-25 ENCOUNTER — Ambulatory Visit (INDEPENDENT_AMBULATORY_CARE_PROVIDER_SITE_OTHER): Payer: Self-pay | Admitting: Allergy

## 2018-02-25 ENCOUNTER — Ambulatory Visit
Admission: RE | Admit: 2018-02-25 | Discharge: 2018-02-25 | Disposition: A | Discharge status: Home / Self Care | Payer: Medicaid Other | Source: Ambulatory Visit | Attending: Allergy | Admitting: Allergy

## 2018-02-25 ENCOUNTER — Encounter: Payer: Self-pay | Admitting: Physician Assistant

## 2018-02-25 ENCOUNTER — Encounter: Payer: Self-pay | Admitting: Allergy

## 2018-02-25 VITALS — BP 114/84 | HR 72 | Resp 16 | Ht 67.5 in | Wt 159.2 lb

## 2018-02-25 DIAGNOSIS — Z9109 Other allergy status, other than to drugs and biological substances: Secondary | ICD-10-CM

## 2018-02-25 DIAGNOSIS — R05 Cough: Secondary | ICD-10-CM

## 2018-02-25 DIAGNOSIS — R059 Cough, unspecified: Secondary | ICD-10-CM

## 2018-02-25 DIAGNOSIS — J3089 Other allergic rhinitis: Secondary | ICD-10-CM

## 2018-02-25 DIAGNOSIS — J4540 Moderate persistent asthma, uncomplicated: Secondary | ICD-10-CM | POA: Insufficient documentation

## 2018-02-25 DIAGNOSIS — R1110 Vomiting, unspecified: Secondary | ICD-10-CM | POA: Insufficient documentation

## 2018-02-25 DIAGNOSIS — R0982 Postnasal drip: Secondary | ICD-10-CM

## 2018-02-25 MED ORDER — OMEPRAZOLE 40 MG PO CPDR: 40.0000 mg | DELAYED_RELEASE_CAPSULE | Freq: Every day | ORAL | 5 refills | Status: DC

## 2018-02-25 NOTE — Telephone Encounter (Addendum)
Urgent referral can you please refer to GI per Dr Maudie Mercury patient vomiting with blood, hx of reflux,  EGD attempted in [place] couldn't complete. Patient has [employer] financial assistance.

## 2018-02-25 NOTE — Telephone Encounter (Signed)
Patient is set up for 02/28/18 at 8:45 to see the PA

## 2018-02-25 NOTE — Assessment & Plan Note (Addendum)
Patient diagnosed with asthma many years ago. Currently on Symbicort 160 2 puffs BID and albuterol prn with some benefit. Recently finished azithromycin and prednisone with some borderline benefit but coughing is still the same. 2019 immunocap was borderline positive to grass and weeds.    Today's spirometry showed: normal pattern and no significant improvement in FEV1 post bronchodilator treatment.  Continue Symbicort 160 2 puffs twice a day.  Continue Singulair 10mg  daily.  May use albuterol rescue inhaler 2 puffs or nebulizer every 4 to 6 hours as needed for shortness of breath, chest tightness, coughing, and wheezing. May use albuterol rescue inhaler 2 puffs 5 to 15 minutes prior to strenuous physical activities. Monitor frequency of use.

## 2018-02-25 NOTE — Patient Instructions (Addendum)
Get chest X-ray  Start omeprazole 40mg  daily Continue zantac 300mg  daily.  Refer to GI.  Do not take any NSAIDs, aleve, naprosyn, ibuprofen for now. Take tylenol for pain  Continue symbicort 160 2 puffs twice a day  May use albuterol rescue inhaler 2 puffs or nebulizer every 4 to 6 hours as needed for shortness of breath, chest tightness, coughing, and wheezing. May use albuterol rescue inhaler 2 puffs 5 to 15 minutes prior to strenuous physical activities. Monitor frequency of use.   Follow up in 3 weeks.

## 2018-02-25 NOTE — Progress Notes (Signed)
New Patient Note  RE: Derek Smith MRN: [id number] DOB: 1971/01/08 Date of Office Visit: 02/25/2018  Referring provider: Clent Demark, PA-C Primary care provider: Clent Demark, PA-C  Chief Complaint: New Patient (Initial Visit) (dry cough 4 months starts choking at time and vomits )  History of Present Illness: I had the pleasure of seeing Hau Sanor for initial evaluation at the [facility] on 02/25/2018. He is a 47 y.o. male, who is referred here by Clent Demark, PA-C for the evaluation of dry coughing. He is accompanied today by his significant other who provided/contributed to the history. Derek Smith interpreter present during the visit.   He reports symptoms of coughing with post tussive emesis, SOB, wheezing, nocturnal awakenings for 4 months. It started with a little itch in the throat but now getting worse. Every emesis contains some dark blood. Minty products seem to help a little bit. Patient does have history of reflux and asthma. Currently on zantac 300mg  BID x 3 yrs with some benefit. Patient had an attempted EGD in [place] about 5 years ago but was unable to complete due to vomiting. No triggers noted and can occur anytime.Denies any changes in diet. No weight loss. Denies any fevers or chills. Patient has been in the [place] for 2 years and works in a warehouse.   Takes naprosyn as needed for pain at times.   Current medications include Symbicort 160 2 puffs BID x 1 year and albuterol prn which help. He reports not using aerochamber with inhalers. He tried the following inhalers: none. Main triggers are weather changes. In the last month, frequency of symptoms: <1x/week. Frequency of SABA use: <1x/week. Interference with physical activity: no. Sleep is disturbed. In the last 12 months, emergency room visits/urgent care visits/doctor office visits or hospitalizations due to respiratory issues: multiple times to PCP. In the  last 12 months, oral steroids courses: 3 times - last episode was 1 month ago which helped the coughing and was also given azithromycin. History of pneumonia: no. He was not evaluated by allergist/pulmonologist in the past. Smoking exposure: no. Up to date with flu vaccine: no.  05/17/2016 CXR:  IMPRESSION: No active cardiopulmonary disease.  Assessment and Plan: Willies is a 47 y.o. male with: Coughing Coughing with post tussive emesis, SOB, wheezing and nocturnal awakenings for the past 4 months. Patient does have history of asthma and GERD but does not feel like this coughing is from his asthma. He also mentions dark blood in the emesis. Denies any fevers, chills, weight loss. Currently on zantac 300mg  BID. 2019 immunocap was borderline positive to grass and weed. No EGD recently.  Discussed with patient and spouse that given his other symptoms I do not think this coughing is from allergies. I am more concerned if there's some type of GI component to this.  Refer to GI for further evaluation.  Get chest X-ray.  Start omeprazole 40mg  daily  Continue zantac 300mg  daily.  Do not take any NSAIDs, aleve, naprosyn, ibuprofen for now. Take tylenol for pain.  Vomiting See assessment and plan as above.   Moderate persistent asthma without complication Patient diagnosed with asthma many years ago. Currently on Symbicort 160 2 puffs BID and albuterol prn with some benefit. Recently finished azithromycin and prednisone with some borderline benefit but coughing is still the same. 2019 immunocap was borderline positive to grass and weeds.    Today's spirometry showed: normal pattern and no significant improvement in  FEV1 post bronchodilator treatment.  Continue Symbicort 160 2 puffs twice a day.  Continue Singulair 10mg  daily.  May use albuterol rescue inhaler 2 puffs or nebulizer every 4 to 6 hours as needed for shortness of breath, chest tightness, coughing, and wheezing. May use albuterol  rescue inhaler 2 puffs 5 to 15 minutes prior to strenuous physical activities. Monitor frequency of use.   Return in about 3 weeks (around 03/18/2018).  Meds ordered this encounter  Medications  . omeprazole (PRILOSEC) 40 MG capsule    Sig: Take 1 capsule (40 mg total) by mouth daily.    Dispense:  30 capsule    Refill:  5   Other allergy screening: Asthma: yes Rhino conjunctivitis: no Food allergy: no Medication allergy: no Hymenoptera allergy: no Urticaria: no Eczema:no History of recurrent infections suggestive of immunodeficency: no  Diagnostics: Spirometry:  Tracings reviewed. His effort: Good reproducible efforts. FVC: 3.86L FEV1: 3.12L, 82% predicted FEV1/FVC ratio: 81% Interpretation: Spirometry consistent with normal pattern and no significant improvement in FEV1 post bronchodilator treatment. Please see scanned spirometry results for details.  Skin Testing: Deferred due to recent antihistamines use.  Past Medical History: Patient Active Problem List   Diagnosis Date Noted  . Coughing 02/25/2018  . Vomiting 02/25/2018  . Moderate persistent asthma without complication 02/58/5277  . Precordial chest pain   . Syncope 03/28/2016   Past Medical History:  Diagnosis Date  . Anxiety   . Asthma   . 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 Syncope 03/28/2016   Past Surgical History: Past Surgical History:  Procedure Laterality Date  . LEFT HEART CATH AND CORONARY ANGIOGRAPHY N/A 05/22/2016   Procedure: Left Heart Cath and Coronary Angiography;  Surgeon: Jettie Booze, MD;  Location: [facility];  Service: Cardiovascular;  Laterality: N/A;  . NO PAST SURGERIES     Medication List:  Current Outpatient Medications  Medication Sig Dispense Refill  . acetaminophen (TYLENOL) 500 MG tablet Take 1,000 mg by mouth daily as needed for moderate pain or headache.    . albuterol (PROVENTIL HFA;VENTOLIN HFA) 108 (90 Base) MCG/ACT inhaler Inhale 2  puffs into the lungs every 4 (four) hours as needed for wheezing or shortness of breath. 1 Inhaler 11  . aspirin EC 81 MG tablet Take 81 mg by mouth every evening.    . budesonide-formoterol (SYMBICORT) 160-4.5 MCG/ACT inhaler Inhale 2 puffs into the lungs 2 (two) times daily. 1 Inhaler 11  . cetirizine (ZYRTEC) 10 MG tablet Take 1 tablet (10 mg total) by mouth every morning. 30 tablet 3  . gabapentin (NEURONTIN) 300 MG capsule Take 300 mg by mouth 3 (three) times daily.    . hydrOXYzine (ATARAX/VISTARIL) 25 MG tablet Take 1 tablet (25 mg total) by mouth at bedtime. 30 tablet 3  . montelukast (SINGULAIR) 10 MG tablet Take 1 tablet (10 mg total) by mouth at bedtime. 30 tablet 11  . omeprazole (PRILOSEC) 40 MG capsule Take 1 capsule (40 mg total) by mouth daily. 30 capsule 5   No current facility-administered medications for this visit.    Allergies: No Known Allergies Social History: Social History   Socioeconomic History  . Marital status: Married    Spouse name: Not on file  . Number of children: Not on file  . Years of education: Not on file  . Highest education level: Not on file  Occupational History  . Not on file  Social Needs  . Financial resource strain: Not on  file  . Food insecurity:    Worry: Not on file    Inability: Not on file  . Transportation needs:    Medical: Not on file    Non-medical: Not on file  Tobacco Use  . Smoking status: Never Smoker  . Smokeless tobacco: Never Used  Substance and Sexual Activity  . Alcohol use: No  . Drug use: No  . Sexual activity: Yes  Lifestyle  . Physical activity:    Days per week: Not on file    Minutes per session: Not on file  . Stress: Not on file  Relationships  . Social connections:    Talks on phone: Not on file    Gets together: Not on file    Attends religious service: Not on file    Active member of club or organization: Not on file    Attends meetings of clubs or organizations: Not on file    Relationship  status: Not on file  Other Topics Concern  . Not on file  Social History Narrative  . Not on file   Lives in an apartment. Smoking: no Occupation: closes boxes in Geophysicist/field seismologist History: Environmental education officer in the house: no Charity fundraiser in the family room: yes Carpet in the bedroom: yes Heating: electric Cooling: central Pet: no  Family History: Family History  Problem Relation Age of Onset  . GI problems Mother   . Allergic rhinitis Neg Hx   . Eczema Neg Hx   . Asthma Neg Hx   . Urticaria Neg Hx    Problem                               Relation Asthma                                   No  Eczema                                No  Food allergy                          No  Allergic rhino conjunctivitis     No   Review of Systems  Constitutional: Negative for appetite change, chills, fever and unexpected weight change.  HENT: Negative for congestion and rhinorrhea.   Eyes: Negative for itching.  Respiratory: Positive for cough, chest tightness and shortness of breath. Negative for wheezing.   Cardiovascular: Negative for chest pain.  Gastrointestinal: Positive for vomiting. Negative for abdominal pain.  Genitourinary: Negative for difficulty urinating.  Skin: Negative for rash.  Allergic/Immunologic: Negative for environmental allergies and food allergies.  Neurological: Positive for headaches.   Objective: BP 114/84 (BP Location: Left Arm, Patient Position: Sitting, Cuff Size: Normal)   Pulse 72   Resp 16   Ht 5' 7.5" (1.715 m)   Wt 159 lb 3.2 oz (72.2 kg)   SpO2 96%   BMI 24.57 kg/m  Body mass index is 24.57 kg/m. Physical Exam  Constitutional: He is oriented to person, place, and time. He appears well-developed and well-nourished.  HENT:  Head: Normocephalic and atraumatic.  Right Ear: External ear normal.  Left Ear: External ear normal.  Nose: Nose normal.  Mouth/Throat: Oropharynx is clear and moist.  Eyes: Conjunctivae and EOM  are normal.    Neck: Neck supple.  Cardiovascular: Normal rate, regular rhythm and normal heart sounds. Exam reveals no gallop and no friction rub.  No murmur heard. Pulmonary/Chest: Effort normal and breath sounds normal. He has no wheezes. He has no rales.  Abdominal: Soft.  Lymphadenopathy:    He has no cervical adenopathy.  Neurological: He is alert and oriented to person, place, and time.  Skin: Skin is warm. No rash noted.  Psychiatric: He has a normal mood and affect. His behavior is normal.  Nursing note and vitals reviewed.  The plan was reviewed with the patient/family, and all questions/concerned were addressed.  It was my pleasure to see Derek Smith today and participate in his care. Please feel free to contact me with any questions or concerns.  Sincerely,  Rexene Alberts, DO Allergy & Immunology  [facility] office: [phone] [place]

## 2018-02-25 NOTE — Assessment & Plan Note (Signed)
.   See assessment and plan as above. 

## 2018-02-25 NOTE — Assessment & Plan Note (Addendum)
Coughing with post tussive emesis, SOB, wheezing and nocturnal awakenings for the past 4 months. Patient does have history of asthma and GERD but does not feel like this coughing is from his asthma. He also mentions dark blood in the emesis. Denies any fevers, chills, weight loss. Currently on zantac 300mg  BID. 2019 immunocap was borderline positive to grass and weed. No EGD recently.  Discussed with patient and spouse that given his other symptoms I do not think this coughing is from allergies. I am more concerned if there's some type of GI component to this.  Refer to GI for further evaluation.  Get chest X-ray.  Start omeprazole 40mg  daily  Continue zantac 300mg  daily.  Do not take any NSAIDs, aleve, naprosyn, ibuprofen for now. Take tylenol for pain.

## 2018-02-26 NOTE — Telephone Encounter (Signed)
Spoke to wife on 02/25/18 @ 4:30pm advised of appt also emailed her information in regards to appt.

## 2018-02-26 NOTE — Telephone Encounter (Signed)
FWD to PCP. Tempestt S Roberts, CMA  

## 2018-02-28 ENCOUNTER — Ambulatory Visit (INDEPENDENT_AMBULATORY_CARE_PROVIDER_SITE_OTHER): Payer: Self-pay | Admitting: Physician Assistant

## 2018-02-28 ENCOUNTER — Encounter: Payer: Self-pay | Admitting: Physician Assistant

## 2018-02-28 VITALS — BP 100/60 | HR 64 | Ht 67.0 in | Wt 156.6 lb

## 2018-02-28 DIAGNOSIS — R05 Cough: Secondary | ICD-10-CM

## 2018-02-28 DIAGNOSIS — R059 Cough, unspecified: Secondary | ICD-10-CM

## 2018-02-28 DIAGNOSIS — K920 Hematemesis: Secondary | ICD-10-CM

## 2018-02-28 DIAGNOSIS — K219 Gastro-esophageal reflux disease without esophagitis: Secondary | ICD-10-CM

## 2018-02-28 NOTE — Patient Instructions (Signed)
Continue Omeprazole 40 mg every morning 30-60- min before breakfast. Continue Zantac 300 mg at bedtime.  You have been scheduled for an endoscopy. Please follow written instructions given to you at your visit today. If you use inhalers (even only as needed), please bring them with you on the day of your procedure. Your physician has requested that you go to www.startemmi.com and enter the access code given to you at your visit today. This web site gives a general overview about your procedure. However, you should still follow specific instructions given to you by our office regarding your preparation for the procedure.

## 2018-02-28 NOTE — Progress Notes (Signed)
Chief Complaint: Cough with post-tussive emesis  HPI:    [name] is a 47 year old [place] male, who presents to clinic along with an interpreter, who was referred to me by Garnet Sierras, DO for a complaint of cough with posttussive emesis.      02/25/2018 patient saw allergist and reported coughing with posttussive emesis, shortness of breath, wheezing and nocturnal awakenings for 4 months.  Described that every emesis contained some dark blood.  Mint products seemed to help a little bit.  He was on Zantac 300 mg twice daily x3 years with some benefit.  Did discuss an attempted EGD in [place] 5 years ago which was unable to be completed due to vomiting.  Did describe using Naprosyn as needed for pain at times.  Started on Omeprazole 40 mg daily and continued on Zantac.    Today, patient presents clinic and explains that for the past 4 to 5 months he has had a cough "day and night" which seems to be aggravated by eating.  Also describes that when he coughs at times he will see a small amount of bright red or dark blood.  Patient recently started Omeprazole 40 mg every morning 2 days ago and is still taking his Zantac 300 mg at night before bedtime.  Does admit to eating some reflux aggravating foods.     Social history positive for leaving for [place] this Sunday not coming back until January.     Denies fever, chills, weight loss, anorexia, nausea, vomiting, change in bowel habits or melena.  Past Medical History:  Diagnosis Date  . Anxiety   . Asthma   . 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 Syncope 03/28/2016    Past Surgical History:  Procedure Laterality Date  . LEFT HEART CATH AND CORONARY ANGIOGRAPHY N/A 05/22/2016   Procedure: Left Heart Cath and Coronary Angiography;  Surgeon: Jettie Booze, MD;  Location: [facility];  Service: Cardiovascular;  Laterality: N/A;  . NO PAST SURGERIES      Current Outpatient Medications  Medication Sig  Dispense Refill  . acetaminophen (TYLENOL) 500 MG tablet Take 1,000 mg by mouth daily as needed for moderate pain or headache.    . albuterol (PROVENTIL HFA;VENTOLIN HFA) 108 (90 Base) MCG/ACT inhaler Inhale 2 puffs into the lungs every 4 (four) hours as needed for wheezing or shortness of breath. 1 Inhaler 11  . aspirin EC 81 MG tablet Take 81 mg by mouth every evening.    . budesonide-formoterol (SYMBICORT) 160-4.5 MCG/ACT inhaler Inhale 2 puffs into the lungs 2 (two) times daily. 1 Inhaler 11  . cetirizine (ZYRTEC) 10 MG tablet TAKE 1 TABLET (10 MG TOTAL) BY MOUTH EVERY MORNING. 30 tablet 3  . gabapentin (NEURONTIN) 300 MG capsule Take 300 mg by mouth 3 (three) times daily.    . hydrOXYzine (ATARAX/VISTARIL) 25 MG tablet Take 1 tablet (25 mg total) by mouth at bedtime. 30 tablet 3  . montelukast (SINGULAIR) 10 MG tablet Take 1 tablet (10 mg total) by mouth at bedtime. 30 tablet 11  . omeprazole (PRILOSEC) 40 MG capsule Take 1 capsule (40 mg total) by mouth daily. 30 capsule 5   No current facility-administered medications for this visit.     Allergies as of 02/28/2018  . (No Known Allergies)    Family History  Problem Relation Age of Onset  . GI problems Mother   . Allergic rhinitis Neg Hx   . Eczema  Neg Hx   . Asthma Neg Hx   . Urticaria Neg Hx     Social History   Socioeconomic History  . Marital status: Married    Spouse name: Not on file  . Number of children: Not on file  . Years of education: Not on file  . Highest education level: Not on file  Occupational History  . Not on file  Social Needs  . Financial resource strain: Not on file  . Food insecurity:    Worry: Not on file    Inability: Not on file  . Transportation needs:    Medical: Not on file    Non-medical: Not on file  Tobacco Use  . Smoking status: Never Smoker  . Smokeless tobacco: Never Used  Substance and Sexual Activity  . Alcohol use: No  . Drug use: No  . Sexual activity: Yes  Lifestyle  .  Physical activity:    Days per week: Not on file    Minutes per session: Not on file  . Stress: Not on file  Relationships  . Social connections:    Talks on phone: Not on file    Gets together: Not on file    Attends religious service: Not on file    Active member of club or organization: Not on file    Attends meetings of clubs or organizations: Not on file    Relationship status: Not on file  . Intimate partner violence:    Fear of current or ex partner: Not on file    Emotionally abused: Not on file    Physically abused: Not on file    Forced sexual activity: Not on file  Other Topics Concern  . Not on file  Social History Narrative  . Not on file    Review of Systems:    Constitutional: No weight loss, fever or chills Skin: No rash Cardiovascular: No chest pain Respiratory: +cough Gastrointestinal: See HPI and otherwise negative Genitourinary: No dysuria  Neurological: No headache, dizziness or syncope Musculoskeletal: No new muscle or joint pain Hematologic: No bruising Psychiatric: No history of depression or anxiety   Physical Exam:  Vital signs: BP 100/60   Pulse 64   Ht 5\' 7"  (1.702 m)   Wt 156 lb 9.6 oz (71 kg)   BMI 24.53 kg/m   Constitutional:   Pleasant Hispanic male appears to be in NAD, Well developed, Well nourished, alert and cooperative Head:  Normocephalic and atraumatic. Eyes:   PEERL, EOMI. No icterus. Conjunctiva pink. Ears:  Normal auditory acuity. Neck:  Supple Throat: Oral cavity and pharynx without inflammation, swelling or lesion.  Respiratory: Respirations even and unlabored. Lungs clear to auscultation bilaterally.   No wheezes, crackles, or rhonchi.  Cardiovascular: Normal S1, S2. No MRG. Regular rate and rhythm. No peripheral edema, cyanosis or pallor.  Gastrointestinal:  Soft, nondistended, Mild epigastric ttp, No rebound or guarding. Normal bowel sounds. No appreciable masses or hepatomegaly. Rectal:  Not performed.  Msk:   Symmetrical without gross deformities. Without edema, no deformity or joint abnormality.  Neurologic:  Alert and  oriented x4;  grossly normal neurologically.  Skin:   Dry and intact without significant lesions or rashes. Psychiatric: Demonstrates good judgement and reason without abnormal affect or behaviors.  MOST RECENT LABS AND IMAGING: CBC    Component Value Date/Time   WBC 5.5 03/22/2017 1145   RBC 4.90 03/22/2017 1145   HGB 14.6 09/21/2017 1415   HGB 14.5 10/04/2016 1533   HCT 43.0  09/21/2017 1415   HCT 43.3 10/04/2016 1533   PLT 242 03/22/2017 1145   PLT 260 10/04/2016 1533   MCV 90.6 03/22/2017 1145   MCV 91 10/04/2016 1533   MCH 30.4 03/22/2017 1145   MCHC 33.6 03/22/2017 1145   RDW 11.8 03/22/2017 1145   RDW 12.7 10/04/2016 1533   LYMPHSABS 2.0 03/22/2017 1145   LYMPHSABS 2.3 10/04/2016 1533   MONOABS 0.6 03/22/2017 1145   EOSABS 0.2 03/22/2017 1145   EOSABS 0.2 10/04/2016 1533   BASOSABS 0.0 03/22/2017 1145   BASOSABS 0.0 10/04/2016 1533    CMP     Component Value Date/Time   NA 141 09/21/2017 1415   NA 142 10/04/2016 1533   K 3.6 09/21/2017 1415   CL 104 09/21/2017 1415   CO2 29 03/22/2017 1145   GLUCOSE 108 (H) 09/21/2017 1415   BUN 13 09/21/2017 1415   BUN 18 10/04/2016 1533   CREATININE 1.00 09/21/2017 1415   CREATININE 0.96 03/01/2016 1141   CALCIUM 9.3 03/22/2017 1145   PROT 7.3 03/22/2017 1145   PROT 7.6 10/04/2016 1533   ALBUMIN 4.2 03/22/2017 1145   ALBUMIN 4.7 10/04/2016 1533   AST 20 03/22/2017 1145   ALT 20 03/22/2017 1145   ALKPHOS 57 03/22/2017 1145   BILITOT 0.7 03/22/2017 1145   BILITOT 0.4 10/04/2016 1533   GFRNONAA >60 03/22/2017 1145   GFRAA >60 03/22/2017 1145    Assessment: 1.  Cough: Consider relation to reflux as this is worse after meals, with some posttussive emesis, likely Mallory-Weiss tear versus PUD given history of Aleve usage in the past 2.  GERD: Uncontrolled on Zantac 300 mg at night, patient just started  Omeprazole 40 daily  Plan: 1.  Scheduled patient for an EGD in the [place] with Dr. Havery Moros.  The patient is leaving for [place] this weekend and not coming back until January.  This was scheduled after he returns.  Did discuss risks, benefits, limitations and alternatives and patient agrees to proceed. 2.  Patient encouraged to continue his Omeprazole 40 mg every morning, 30-60 minutes before eating breakfast.  This was recently prescribed by another physician. 3.  Continue Zantac 300 mg nightly 4.  Reviewed antireflux diet and lifestyle modifications. 5.  Patient to follow in clinic per recommendations from Dr. Havery Moros after time of procedure.  Ellouise Newer, PA-C [facility] 02/28/2018, 8:43 AM  Cc: Garnet Sierras, DO

## 2018-03-01 ENCOUNTER — Telehealth: Payer: Self-pay

## 2018-03-01 NOTE — Progress Notes (Signed)
Please tell patient to stop Zantac and increase Omerpazole to 40 BID until EGD. Thanks-JLL

## 2018-03-01 NOTE — Progress Notes (Signed)
Agree with the note as outlined with the following changes. Would stop Zantac given the FDA recall on this medication, and increase omeprazole to 40mg  BID until EGD is done. Thanks

## 2018-03-01 NOTE — Telephone Encounter (Signed)
-----   Message from Levin Erp, [place] sent at 03/01/2018 10:41 AM EST -----   ----- Message ----- From: Yetta Flock, MD Sent: 03/01/2018   7:44 AM EST To: Levin Erp, PA    ----- Message ----- From: Levin Erp, [place] Sent: 02/28/2018   9:32 AM EST To: Yetta Flock, MD

## 2018-03-01 NOTE — Telephone Encounter (Signed)
Author: Levin Erp, PA Service: Gastroenterology Author Type: Physician Assistant  Filed: 03/01/2018 10:41 AM Encounter Date: 02/28/2018 Status: Signed  Editor: Levin Erp, PA (Physician Assistant)     Please tell patient to stop Zantac and increase Omerpazole to 40 BID until EGD. Thanks-JLL     Derek Smith will call the pt due to language barrier.

## 2018-03-20 ENCOUNTER — Ambulatory Visit (AMBULATORY_SURGERY_CENTER): Payer: Self-pay | Admitting: Gastroenterology

## 2018-03-20 ENCOUNTER — Encounter: Payer: Self-pay | Admitting: Gastroenterology

## 2018-03-20 VITALS — BP 112/74 | HR 71 | Temp 98.0°F | Resp 11

## 2018-03-20 DIAGNOSIS — K219 Gastro-esophageal reflux disease without esophagitis: Secondary | ICD-10-CM

## 2018-03-20 DIAGNOSIS — R053 Chronic cough: Secondary | ICD-10-CM

## 2018-03-20 DIAGNOSIS — R05 Cough: Secondary | ICD-10-CM

## 2018-03-20 MED ORDER — SODIUM CHLORIDE 0.9 % IV SOLN: 500.0000 mL | Freq: Once | INTRAVENOUS | Status: DC

## 2018-03-20 MED ORDER — OMEPRAZOLE 40 MG PO CPDR: 40.0000 mg | DELAYED_RELEASE_CAPSULE | Freq: Two times a day (BID) | ORAL | 3 refills | Status: DC

## 2018-03-20 NOTE — Progress Notes (Signed)
A and O x3. Report to RN. Tolerated MAC anesthesia well.

## 2018-03-20 NOTE — Op Note (Signed)
[facility] Patient Name: Derek Smith Procedure Date: 03/20/2018 10:27 AM MRN: [id number] Endoscopist: Remo Lipps P. Havery Moros , MD Age: 48 Referring MD:  Date of Birth: Oct 30, 1970 Gender: Male Account #: [id number] Procedure:                Upper GI endoscopy Indications:              persistent gastro-esophageal reflux disease despite                            high dose PPI, Chronic cough Medicines:                Monitored Anesthesia Care Procedure:                Pre-Anesthesia Assessment:                           - Prior to the procedure, a History and Physical                            was performed, and patient medications and                            allergies were reviewed. The patient's tolerance of                            previous anesthesia was also reviewed. The risks                            and benefits of the procedure and the sedation                            options and risks were discussed with the patient.                            All questions were answered, and informed consent                            was obtained. Prior Anticoagulants: The patient has                            taken no previous anticoagulant or antiplatelet                            agents. ASA Grade Assessment: II - A patient with                            mild systemic disease. After reviewing the risks                            and benefits, the patient was deemed in                            satisfactory condition to undergo the procedure.  After obtaining informed consent, the endoscope was                            passed under direct vision. Throughout the                            procedure, the patient's blood pressure, pulse, and                            oxygen saturations were monitored continuously. The                            Endoscope was introduced through the mouth, and                            advanced to the  second part of duodenum. The upper                            GI endoscopy was accomplished without difficulty.                            The patient tolerated the procedure well. Scope In: Scope Out: Findings:                 Esophagogastric landmarks were identified: the                            Z-line was found at 39 cm, the gastroesophageal                            junction was found at 39 cm and the upper extent of                            the gastric folds was found at 39 cm from the                            incisors.                           The exam of the esophagus was otherwise normal. No                            inflammatory changes / reflux, or changes                            concerning for EoE.                           The entire examined stomach was normal.                           The duodenal bulb and second portion of the                            duodenum  were normal. Complications:            No immediate complications. Estimated blood loss:                            None. Estimated Blood Loss:     Estimated blood loss: none. Impression:               - Esophagogastric landmarks identified.                           - Normal esophagus. No inflammatory changes                            appreciated.                           - Normal stomach.                           - Normal duodenal bulb and second portion of the                            duodenum.                           If the patient is having reflux causing symptoms,                            it would be non-erosive. Consideration for pH /                            impedance study on PPI to clarify what is driving                            the patients symptoms Recommendation:           - Patient has a contact number available for                            emergencies. The signs and symptoms of potential                            delayed complications were discussed with the                             patient. Return to normal activities tomorrow.                            Written discharge instructions were provided to the                            patient.                           - Resume previous diet.                           -  Continue present medications.                           - Will discuss possible pH / impedance study with                            the patient if symptoms persist Steven P. Armbruster, MD 03/20/2018 10:57:41 AM This report has been signed electronically.

## 2018-03-20 NOTE — Progress Notes (Signed)
Derek Smith from third floor interpreted for patient and Dr. Havery Moros. Discharge instructions given

## 2018-03-20 NOTE — Progress Notes (Signed)
Interpreter used today at the [facility] for this pt.  Interpreter's name is- Lynden Oxford

## 2018-03-21 ENCOUNTER — Telehealth: Payer: Self-pay | Admitting: *Deleted

## 2018-03-21 NOTE — Telephone Encounter (Signed)
  Follow up Call-  Call back number 03/20/2018  Post procedure Call Back phone  # [phone]  Permission to leave phone message Yes     Patient questions:  Do you have a fever, pain , or abdominal swelling? No. Pain Score  0 *  Have you tolerated food without any problems? Yes.    Have you been able to return to your normal activities? Yes.    Do you have any questions about your discharge instructions: Diet   No. Medications  No. Follow up visit  No.  Do you have questions or concerns about your Care? No.  Actions: * If pain score is 4 or above: No action needed, pain <4.  Patient doesn't speak english was able to tell me he was doing "[place]."

## 2018-03-22 ENCOUNTER — Other Ambulatory Visit: Payer: Self-pay

## 2018-03-22 ENCOUNTER — Telehealth: Payer: Self-pay

## 2018-03-22 DIAGNOSIS — R053 Chronic cough: Secondary | ICD-10-CM

## 2018-03-22 DIAGNOSIS — K219 Gastro-esophageal reflux disease without esophagitis: Secondary | ICD-10-CM

## 2018-03-22 DIAGNOSIS — R05 Cough: Secondary | ICD-10-CM

## 2018-03-22 NOTE — Telephone Encounter (Signed)
You have been scheduled for an esophageal manometry at [facility] on 04/24/2018 at 10:30 am. Please arrive 30 minutes prior to your procedure for registration. You will need to go to outpatient registration (1st floor of the hospital) first. Make certain to bring your insurance cards as well as a complete list of medications.  Please remember the following:  1) Do not take any muscle relaxants, xanax (alprazolam) or ativan for 1 day prior to your test as well as the day of the test.  2) Nothing to eat or drink for 4 hours before your test.  3) Hold all diabetic medications/insulin the morning of the test. You may eat and take your medications after the test.  It will take at least 2 weeks to receive the results of this test from your physician. ------------------------------------------ ABOUT ESOPHAGEAL MANOMETRY Esophageal manometry (muh-NOM-uh-tree) is a test that gauges how well your esophagus works. Your esophagus is the long, muscular tube that connects your throat to your stomach. Esophageal manometry measures the rhythmic muscle contractions (peristalsis) that occur in your esophagus when you swallow. Esophageal manometry also measures the coordination and force exerted by the muscles of your esophagus.  During esophageal manometry, a thin, flexible tube (catheter) that contains sensors is passed through your nose, down your esophagus and into your stomach. Esophageal manometry can be helpful in diagnosing some mostly uncommon disorders that affect your esophagus.  Why it's done Esophageal manometry is used to evaluate the movement (motility) of food through the esophagus and into the stomach. The test measures how well the circular bands of muscle (sphincters) at the top and bottom of your esophagus open and close, as well as the pressure, strength and pattern of the wave of esophageal muscle contractions that moves food along.  What you can expect Esophageal manometry is an  outpatient procedure done without sedation. Most people tolerate it well. You may be asked to change into a hospital gown before the test starts.  During esophageal manometry  . While you are sitting up, a member of your health care team sprays your throat with a numbing medication or puts numbing gel in your nose or both.  . A catheter is guided through your nose into your esophagus. The catheter may be sheathed in a water-filled sleeve. It doesn't interfere with your breathing. However, your eyes may water, and you may gag. You may have a slight nosebleed from irritation.  . After the catheter is in place, you may be asked to lie on your back on an exam table, or you may be asked to remain seated.  . You then swallow small sips of water. As you do, a computer connected to the catheter records the pressure, strength and pattern of your esophageal muscle contractions.  . During the test, you'll be asked to breathe slowly and smoothly, remain as still as possible, and swallow only when you're asked to do so.  . A member of your health care team may move the catheter down into your stomach while the catheter continues its measurements.  . The catheter then is slowly withdrawn. The test usually lasts 20 to 30 minutes.  After esophageal manometry  When your esophageal manometry is complete, you may return to your normal activities  This test typically takes 30-45 minutes to complete. ________________________________________________________________________________

## 2018-03-22 NOTE — Telephone Encounter (Signed)
-----   Message from Yetta Flock, MD sent at 03/20/2018 11:16 AM EST ----- Regarding: Pony study Jennetta Flood, Can you help coordinate an esophageal manometry and 24 hour pH impedance study for this patient? Study should be done on PPI. Indication is refractory reflux and chronic cough. Of note, he is Spanish speaking only. Thanks  Richardson Landry

## 2018-04-11 ENCOUNTER — Encounter (INDEPENDENT_AMBULATORY_CARE_PROVIDER_SITE_OTHER): Payer: Self-pay | Admitting: Family Medicine

## 2018-04-11 ENCOUNTER — Ambulatory Visit (INDEPENDENT_AMBULATORY_CARE_PROVIDER_SITE_OTHER): Payer: Self-pay | Admitting: Family Medicine

## 2018-04-11 ENCOUNTER — Other Ambulatory Visit: Payer: Self-pay

## 2018-04-11 VITALS — BP 104/75 | HR 85 | Temp 98.0°F | Ht 67.0 in | Wt 156.2 lb

## 2018-04-11 DIAGNOSIS — J1100 Influenza due to unidentified influenza virus with unspecified type of pneumonia: Secondary | ICD-10-CM

## 2018-04-11 DIAGNOSIS — J3089 Other allergic rhinitis: Secondary | ICD-10-CM

## 2018-04-11 DIAGNOSIS — J4540 Moderate persistent asthma, uncomplicated: Secondary | ICD-10-CM

## 2018-04-11 DIAGNOSIS — F419 Anxiety disorder, unspecified: Secondary | ICD-10-CM

## 2018-04-11 MED ORDER — PREDNISONE 20 MG PO TABS: 20.0000 mg | ORAL_TABLET | Freq: Every day | ORAL | 0 refills | Status: DC

## 2018-04-11 MED ORDER — HYDROXYZINE HCL 25 MG PO TABS: 25.0000 mg | ORAL_TABLET | Freq: Every day | ORAL | 6 refills | Status: DC

## 2018-04-11 MED ORDER — OSELTAMIVIR PHOSPHATE 75 MG PO CAPS: 75.0000 mg | ORAL_CAPSULE | Freq: Two times a day (BID) | ORAL | 0 refills | Status: DC

## 2018-04-11 MED ORDER — CETIRIZINE HCL 10 MG PO TABS: 10.0000 mg | ORAL_TABLET | ORAL | 3 refills | Status: DC

## 2018-04-11 MED ORDER — BUDESONIDE-FORMOTEROL FUMARATE 160-4.5 MCG/ACT IN AERO: 2.0000 | INHALATION_SPRAY | Freq: Two times a day (BID) | RESPIRATORY_TRACT | 6 refills | Status: DC

## 2018-04-11 MED ORDER — ALBUTEROL SULFATE HFA 108 (90 BASE) MCG/ACT IN AERS: 2.0000 | INHALATION_SPRAY | Freq: Four times a day (QID) | RESPIRATORY_TRACT | 6 refills | Status: DC | PRN

## 2018-04-11 MED ORDER — MONTELUKAST SODIUM 10 MG PO TABS: 10.0000 mg | ORAL_TABLET | Freq: Every day | ORAL | 6 refills | Status: DC

## 2018-04-11 NOTE — Patient Instructions (Signed)
Gripe en los adultos Influenza, Adult A la gripe tambin se la conoce como "influenza". Es una infeccin en los pulmones, la nariz y la garganta (vas respiratorias). La causa un virus. La gripe provoca sntomas que son similares a los de un resfro. Tambin causa fiebre alta y dolores corporales. Se transmite fcilmente de persona a persona (es contagiosa). La mejor manera de prevenir la gripe es aplicndose la vacuna contra la gripe todos los aos. Cules son las causas? La causa de esta afeccin es el virus de la influenza. Puede contraer el virus de las siguientes maneras:  Respirar las gotitas que estn en el aire y que provienen de la tos o el estornudo de una persona que tiene el virus.  Tocar algo que tiene el virus (est contaminado) y luego tocarse la boca, la nariz o los ojos. Qu incrementa el riesgo? Hay ciertas cosas que lo pueden hacer ms propenso a tener gripe. Estas incluyen lo siguiente:  No lavarse las manos con frecuencia.  Tener contacto cercano con muchas personas durante la temporada de resfro y gripe.  Tocarse la boca, los ojos o la nariz sin antes lavarse las manos.  No recibir la vacuna antigripal todos los aos. Puede correr un mayor riesgo de tener gripe, junto con problemas graves como una infeccin pulmonar (neumona), si:  Es mayor de 65 aos de edad.  Est embarazada.  Tiene debilitado el sistema que combate las defensas (sistema inmunitario) debido a una enfermedad o porque toma determinados medicamentos.  Tiene una enfermedad prolongada (crnica), por ejemplo: ? Enfermedad cardaca, renal o pulmonar. ? Diabetes. ? Asma.  Tiene un trastorno heptico.  Tiene mucho sobrepeso (obesidad mrbida).  Tiene anemia. Esta es una afeccin que afecta a los glbulos rojos. Cules son los signos o los sntomas? Los sntomas normalmente comienzan de repente y duran entre 4 y 14 das. Pueden incluir los siguientes:  Fiebre y escalofros.  Dolores de  cabeza, dolores en el cuerpo o dolores musculares.  Dolor de garganta.  Tos.  Secrecin o congestin nasal.  Malestar en el pecho.  No desear comer en las cantidades normales (prdida del apetito).  Debilidad o cansancio (fatiga).  Mareos.  Malestar estomacal (nuseas) o ganas de devolver (vmitos). Cmo se trata? Si la gripe se encuentra de forma temprana, se la puede tratar con medicamentos que pueden ayudar a reducir la gravedad de la enfermedad y reducir su duracin (medicamentos antivirales). Estos pueden administrarse por boca (va oral) o por va (catter) intravenosa. Cuidarse en su hogar puede ayudar a que mejoren los sntomas. El mdico puede sugerirle lo siguiente:  Tomar medicamentos de venta libre.  Beber mucho lquido. La gripe suele desaparecer sola. Si tiene sntomas muy graves u otros problemas, puede recibir tratamiento en un hospital. Siga estas indicaciones en su casa:     Actividad  Descanse todo lo que sea necesario. Duerma lo suficiente.  Qudese en su casa y no concurra al trabajo o a la escuela, como se lo haya indicado el mdico. ? No salga de su casa hasta que no haya tenido fiebre por 24horas sin tomar medicamentos. ? Salga de su casa solo para ir al mdico. Comida y bebida  Tome una SRO (solucin de rehidratacin oral). Es una bebida que se vende en farmacias y tiendas.  Beba suficiente lquido para mantener el pis (la orina) de color amarillo plido.  En la medida en que pueda, beba lquidos claros en pequeas cantidades. Los lquidos transparentes son, por ejemplo: ? Agua. ? Trocitos   una SRO (solucin de rehidratacin oral). Es una bebida que se vende en farmacias y tiendas.   Beba suficiente lquido para mantener el pis (la orina) de color amarillo plido.   En la medida en que pueda, beba lquidos claros en pequeas cantidades. Los lquidos transparentes son, por ejemplo:  ? Agua.  ? Trocitos de hielo.  ? Jugo de frutas con agua agregada (jugo de frutas diluido).  ? Bebidas deportivas de bajas caloras.   En la medida en que pueda, consuma alimentos blandos y fciles de digerir en pequeas cantidades. Estos alimentos incluyen:  ? Bananas.  ? Pur de manzana.  ? Arroz.  ? Carnes magras.  ? Tostadas.  ? Galletas.   No coma ni beba lo  siguiente:  ? Lquidos con alto contenido de azcar o cafena.  ? Alcohol.  ? Alimentos condimentados o con alto contenido de grasa.  Indicaciones generales   Tome los medicamentos de venta libre y los recetados solamente como se lo haya indicado el mdico.   Use un humidificador de aire fro para que el aire de su casa est ms hmedo. Esto puede facilitar la respiracin.   Al toser o estornudar, cbrase la boca y la nariz.   Lvese las manos con agua y jabn frecuentemente, en especial despus de toser o estornudar. Use desinfectante para manos con alcohol si no dispone de agua y jabn.   Concurra a todas las visitas de control como se lo haya indicado el mdico. Esto es importante.  Cmo se evita?     Colquese la vacuna antigripal todos los aos. Puede colocarse la vacuna contra la gripe a fines de verano, en otoo o en invierno. Pregntele al mdico cundo debe aplicarse la vacuna contra la gripe.   Evite el contacto con personas que estn enfermas durante el otoo y el invierno (la temporada de resfro y gripe).  Comunquese con un mdico si:   Tiene sntomas nuevos.   Tiene los siguientes sntomas:  ? Dolor en el pecho.  ? Materia fecal lquida (diarrea).  ? Fiebre.   La tos empeora.   Empieza a tener ms mucosidad.   Tiene malestar estomacal.   Vomita.  Solicite ayuda inmediatamente si:   Le falta el aire.   Tiene dificultad para respirar.   La piel o las uas se ponen de un color azulado.   Presenta dolor muy intenso o rigidez en el cuello.   Tiene dolor de cabeza repentino.   Le duele la cara o el odo de forma repentina.   No puede comer ni beber sin vomitar.  Resumen   La gripe es una infeccin en los pulmones, la nariz y la garganta. La causa un virus.   Tome los medicamentos de venta libre y los recetados solamente como se lo haya indicado el mdico.   Aplicarse la vacuna contra la gripe todos los aos es la mejor manera de evitar contagiarse la gripe.  Esta informacin no tiene  como fin reemplazar el consejo del mdico. Asegrese de hacerle al mdico cualquier pregunta que tenga.  Document Released: 05/26/2008 Document Revised: 10/10/2017 Document Reviewed: 10/10/2017  Elsevier Interactive Patient Education  2019 [employer]vier Inc[employer]

## 2018-04-11 NOTE — Progress Notes (Signed)
Subjective:  Patient ID: Derek Smith, male    DOB: Jan 07, 1971  Age: 48 y.o. MRN: [id number]  CC: Fever (cough, bodyaches)   HPI Derek Smith is a 48 year old male with a history of asthma, anxiety who presents with a 3-day history of fever, cough productive of slight sputum which is white, vomiting, myalgias.  His wife has also been sick and been in bed. He denies diarrhea, constipation and has used Tylenol with no relief of symptoms. He endorses wheezing but has been compliant with his asthma medications. He is needing a refill on his anxiety medications as well. Compliant with Zyrtec which he uses for allergic rhinitis  Past Medical History:  Diagnosis Date  . Anxiety   . Asthma   . 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 Syncope 03/28/2016    Past Surgical History:  Procedure Laterality Date  . LEFT HEART CATH AND CORONARY ANGIOGRAPHY N/A 05/22/2016   Procedure: Left Heart Cath and Coronary Angiography;  Surgeon: Jettie Booze, MD;  Location: [facility];  Service: Cardiovascular;  Laterality: N/A;    No Known Allergies   Outpatient Medications Prior to Visit  Medication Sig Dispense Refill  . acetaminophen (TYLENOL) 500 MG tablet Take 1,000 mg by mouth daily as needed for moderate pain or headache.    . gabapentin (NEURONTIN) 300 MG capsule Take 300 mg by mouth 3 (three) times daily.    Marland Kitchen omeprazole (PRILOSEC) 40 MG capsule Take 1 capsule (40 mg total) by mouth 2 (two) times daily. 90 capsule 3  . albuterol (PROVENTIL HFA;VENTOLIN HFA) 108 (90 Base) MCG/ACT inhaler Inhale 2 puffs into the lungs every 4 (four) hours as needed for wheezing or shortness of breath. 1 Inhaler 11  . budesonide-formoterol (SYMBICORT) 160-4.5 MCG/ACT inhaler Inhale 2 puffs into the lungs 2 (two) times daily. 1 Inhaler 11  . cetirizine (ZYRTEC) 10 MG tablet TAKE 1 TABLET (10 MG TOTAL) BY MOUTH EVERY MORNING. 30 tablet 3  . hydrOXYzine (ATARAX/VISTARIL) 25 MG  tablet Take 1 tablet (25 mg total) by mouth at bedtime. 30 tablet 3  . montelukast (SINGULAIR) 10 MG tablet Take 1 tablet (10 mg total) by mouth at bedtime. 30 tablet 11   No facility-administered medications prior to visit.     ROS Review of Systems  Constitutional: Positive for activity change, chills and fever. Negative for appetite change.  HENT: Negative for sinus pressure and sore throat.   Eyes: Negative for visual disturbance.  Respiratory: Positive for cough. Negative for chest tightness and shortness of breath.   Cardiovascular: Negative for chest pain and leg swelling.  Gastrointestinal: Negative for abdominal distention, abdominal pain, constipation and diarrhea.  Endocrine: Negative.   Genitourinary: Negative for dysuria.  Musculoskeletal: Positive for myalgias. Negative for joint swelling.  Skin: Negative for rash.  Allergic/Immunologic: Negative.   Neurological: Negative for weakness, light-headedness and numbness.  Psychiatric/Behavioral: Negative for dysphoric mood and suicidal ideas.    Objective:  BP 104/75 (BP Location: Right Arm, Patient Position: Sitting, Cuff Size: Normal)   Pulse 85   Temp 98 F (36.7 C) (Oral)   Ht 5\' 7"  (1.702 m)   Wt 156 lb 3.2 oz (70.9 kg)   SpO2 98%   BMI 24.46 kg/m   BP/Weight 04/11/2018 03/20/2018 76/22/6333  Systolic BP 545 625 638  Diastolic BP 75 74 60  Wt. (Lbs) 156.2 - 156.6  BMI 24.46 - 24.53      Physical Exam Constitutional:  Appearance: He is well-developed. He is ill-appearing.  Cardiovascular:     Rate and Rhythm: Normal rate.     Heart sounds: Normal heart sounds. No murmur.  Pulmonary:     Effort: Pulmonary effort is normal.     Breath sounds: Wheezing (slight b/l) present. No rales.  Chest:     Chest wall: No tenderness.  Abdominal:     General: Bowel sounds are normal. There is no distension.     Palpations: Abdomen is soft. There is no mass.     Tenderness: There is no abdominal tenderness.   Musculoskeletal: Normal range of motion.  Neurological:     Mental Status: He is alert and oriented to person, place, and time.      Assessment & Plan:   1. Influenza with pneumonia Treat presumptively based on clinical symptoms - oseltamivir (TAMIFLU) 75 MG capsule; Take 1 capsule (75 mg total) by mouth 2 (two) times daily.  Dispense: 10 capsule; Refill: 0 - Viral Cult, Rapid, Respiratory  2. Moderate persistent asthma, unspecified whether complicated Placed on short course of oral prednisone to prevent early exacerbation being driven by influenza - montelukast (SINGULAIR) 10 MG tablet; Take 1 tablet (10 mg total) by mouth at bedtime.  Dispense: 30 tablet; Refill: 6 - budesonide-formoterol (SYMBICORT) 160-4.5 MCG/ACT inhaler; Inhale 2 puffs into the lungs 2 (two) times daily.  Dispense: 1 Inhaler; Refill: 6 - albuterol (PROVENTIL HFA;VENTOLIN HFA) 108 (90 Base) MCG/ACT inhaler; Inhale 2 puffs into the lungs every 6 (six) hours as needed for wheezing or shortness of breath.  Dispense: 1 Inhaler; Refill: 6 - predniSONE (DELTASONE) 20 MG tablet; Take 1 tablet (20 mg total) by mouth daily with breakfast.  Dispense: 5 tablet; Refill: 0  3. Non-seasonal allergic rhinitis, unspecified trigger - cetirizine (ZYRTEC) 10 MG tablet; Take 1 tablet (10 mg total) by mouth every morning.  Dispense: 30 tablet; Refill: 3  4. Anxiety Stable - hydrOXYzine (ATARAX/VISTARIL) 25 MG tablet; Take 1 tablet (25 mg total) by mouth at bedtime.  Dispense: 30 tablet; Refill: 6   Meds ordered this encounter  Medications  . oseltamivir (TAMIFLU) 75 MG capsule    Sig: Take 1 capsule (75 mg total) by mouth 2 (two) times daily.    Dispense:  10 capsule    Refill:  0  . montelukast (SINGULAIR) 10 MG tablet    Sig: Take 1 tablet (10 mg total) by mouth at bedtime.    Dispense:  30 tablet    Refill:  6  . budesonide-formoterol (SYMBICORT) 160-4.5 MCG/ACT inhaler    Sig: Inhale 2 puffs into the lungs 2 (two) times  daily.    Dispense:  1 Inhaler    Refill:  6  . albuterol (PROVENTIL HFA;VENTOLIN HFA) 108 (90 Base) MCG/ACT inhaler    Sig: Inhale 2 puffs into the lungs every 6 (six) hours as needed for wheezing or shortness of breath.    Dispense:  1 Inhaler    Refill:  6  . cetirizine (ZYRTEC) 10 MG tablet    Sig: Take 1 tablet (10 mg total) by mouth every morning.    Dispense:  30 tablet    Refill:  3  . hydrOXYzine (ATARAX/VISTARIL) 25 MG tablet    Sig: Take 1 tablet (25 mg total) by mouth at bedtime.    Dispense:  30 tablet    Refill:  6  . predniSONE (DELTASONE) 20 MG tablet    Sig: Take 1 tablet (20 mg total) by mouth daily with  breakfast.    Dispense:  5 tablet    Refill:  0    Follow-up: Return in about 3 months (around 07/11/2018) for Follow-up of chronic medical conditions.   Charlott Rakes MD

## 2018-04-12 LAB — VIRAL CULT, RAPID, RESPIRATORY

## 2018-04-19 ENCOUNTER — Telehealth (INDEPENDENT_AMBULATORY_CARE_PROVIDER_SITE_OTHER): Payer: Self-pay

## 2018-04-19 NOTE — Telephone Encounter (Signed)
-----   Message from Charlott Rakes, MD sent at 04/18/2018 10:58 AM EST ----- Respiratory viral panel was canceled and so we are unable to have a confirmation or rule out influenza.  Hopefully he is feeling better after the presumptive treatment with Tamiflu.

## 2018-04-19 NOTE — Telephone Encounter (Signed)
Call placed using pac[place] interpreter [phone]) patient does report feeling better since taking tamiflu. Nat Christen, CMA

## 2018-04-23 NOTE — Progress Notes (Signed)
Called and scheduled and interpreter for patient for manometry tomorrow 04/23/2018. Called [phone]. They will be available from 10:00-12:00

## 2018-04-24 ENCOUNTER — Telehealth: Payer: Self-pay

## 2018-04-24 NOTE — Telephone Encounter (Signed)
-----   Message from Ronita Hipps sent at 04/24/2018 10:26 AM EST ----- Patient Daughter Derek Smith stating that her Father Derek Smith was not going to be able to come in to his appointment at [facility] at 10:30 today 04-24-18 due to him being sick and not feeling well. She advised that they do want to reschedule. Best call back # [phone] for Derek Smith patient wife.

## 2018-04-24 NOTE — Telephone Encounter (Signed)
Pt called and cannot make it to his Esophageal Mano and 24 Hr Ph study appt  today.  He called 10 minutes before his appt. Rescheduled pt for 2-26 at 8:30am.  Georgette Shell called the pt and confirmed that he can make the appt on that date and time. He still has his instructions and expressed understanding. Letter mailed to patient with new dates and time.

## 2018-04-24 NOTE — Telephone Encounter (Signed)
Pt rescheduled for 2-24. Pt informed. See other telephone note from today

## 2018-05-07 NOTE — Progress Notes (Signed)
Interpreter scheduled for [id number] tomorrow. [phone].

## 2018-05-08 ENCOUNTER — Ambulatory Visit (HOSPITAL_COMMUNITY)
Admission: RE | Admit: 2018-05-08 | Discharge: 2018-05-08 | Disposition: A | Discharge status: Home / Self Care | Payer: Medicaid Other | Attending: Gastroenterology | Admitting: Gastroenterology

## 2018-05-08 ENCOUNTER — Encounter (HOSPITAL_COMMUNITY)
Admission: RE | Disposition: A | Discharge status: Home / Self Care | Payer: Self-pay | Source: Home / Self Care | Attending: Gastroenterology

## 2018-05-08 DIAGNOSIS — K219 Gastro-esophageal reflux disease without esophagitis: Secondary | ICD-10-CM

## 2018-05-08 DIAGNOSIS — R0989 Other specified symptoms and signs involving the circulatory and respiratory systems: Secondary | ICD-10-CM | POA: Insufficient documentation

## 2018-05-08 DIAGNOSIS — R05 Cough: Secondary | ICD-10-CM | POA: Insufficient documentation

## 2018-05-08 DIAGNOSIS — R1110 Vomiting, unspecified: Secondary | ICD-10-CM | POA: Insufficient documentation

## 2018-05-08 HISTORY — PX: ESOPHAGEAL MANOMETRY: SHX5429

## 2018-05-08 HISTORY — PX: 24 HOUR PH STUDY: SHX5419

## 2018-05-08 SURGERY — MANOMETRY, ESOPHAGUS

## 2018-05-08 MED ORDER — LIDOCAINE VISCOUS HCL 2 % MT SOLN
OROMUCOSAL | Status: AC
  Filled 2018-05-08: qty 15

## 2018-05-08 SURGICAL SUPPLY — 2 items
FACESHIELD LNG OPTICON STERILE (SAFETY) IMPLANT
GLOVE BIO SURGEON STRL SZ8 (GLOVE) ×6 IMPLANT

## 2018-05-08 NOTE — Progress Notes (Signed)
Esophageal manometry and pH study explained to patient and wife using interpretor device  Derek Smith [phone]) prior to beginning procedure.  Patient and wife verbalized understanding.  Esophageal manometry probe placed per protocol.  Patient tolerated procedure without complications.  PH probe placed at 39cm per protocol.  Patient tolerated procedure without complications.  Instructions given to both patient and wife about how to use monitor.  Patient to return tomorrow around [phone].  Dr. Silverio Decamp to interpret test results.

## 2018-05-08 NOTE — Discharge Instructions (Signed)
°  ° ° °  [facility] ENDOSCOPY [place], [place]  [address] Phone:  [phone]   May 08, 2018  Patient: Derek Smith  Date of Birth: 1970-08-04  Date of Visit: May 08, 2018    To Whom It May Concern:  [name] was seen for a procedure on May 08, 2018 and can return to work without restrictions on 05/10/18    .           If you have any questions or concerns, please don't hesitate to call.   Sincerely,       Treatment Team:  Attending Provider: Mauri Pole, MD

## 2018-05-09 ENCOUNTER — Encounter (HOSPITAL_COMMUNITY): Payer: Self-pay | Admitting: Gastroenterology

## 2018-05-10 DIAGNOSIS — K219 Gastro-esophageal reflux disease without esophagitis: Secondary | ICD-10-CM

## 2018-05-13 ENCOUNTER — Ambulatory Visit: Payer: Self-pay | Attending: Family Medicine

## 2018-05-14 ENCOUNTER — Telehealth: Payer: Self-pay | Admitting: Physician Assistant

## 2018-05-14 NOTE — Telephone Encounter (Signed)
Spoke with patient in regards to his application for financial assistance, explained that we need the last 90 days of his [place] ending in 2495, he verbalized understanding and had no questions or concerns. Agreed to bring missing information before his established deadline 05/20/2018.

## 2018-05-15 ENCOUNTER — Telehealth: Payer: Self-pay | Admitting: Physician Assistant

## 2018-05-15 NOTE — Telephone Encounter (Signed)
Please follow up

## 2018-05-15 NOTE — Telephone Encounter (Signed)
Pt called back would like to know which bank statement or missing document his wife is needing to bring in please follow up

## 2018-05-15 NOTE — Telephone Encounter (Signed)
Spoke with patient once more in regards to his financial application, in order to finalize his application we need the last 90 days of the bank account ending in 2495, he verbalized understatement, and had no further questions.The same deadline will be used 05/20/2018.

## 2019-02-04 ENCOUNTER — Ambulatory Visit (INDEPENDENT_AMBULATORY_CARE_PROVIDER_SITE_OTHER): Payer: Self-pay | Admitting: Primary Care

## 2019-02-04 ENCOUNTER — Other Ambulatory Visit: Payer: Self-pay

## 2019-02-04 DIAGNOSIS — J3089 Other allergic rhinitis: Secondary | ICD-10-CM

## 2019-02-04 DIAGNOSIS — F419 Anxiety disorder, unspecified: Secondary | ICD-10-CM

## 2019-02-04 DIAGNOSIS — R071 Chest pain on breathing: Secondary | ICD-10-CM

## 2019-02-04 DIAGNOSIS — J4540 Moderate persistent asthma, uncomplicated: Secondary | ICD-10-CM

## 2019-02-04 NOTE — Progress Notes (Signed)
Discuss medications C/o back and leg pain

## 2019-02-04 NOTE — Progress Notes (Signed)
Virtual Visit via Telephone Note  I connected with Derek Smith on 02/04/19 at  4:10 PM EST by telephone and verified that I am speaking with the correct person using two identifiers.   I discussed the limitations, risks, security and privacy concerns of performing an evaluation and management service by telephone and the availability of in person appointments. I also discussed with the patient that there may be a patient responsible charge related to this service. The patient expressed understanding and agreed to proceed.   History of Present Illness: Derek Smith is establishing care with new provider but is established with [facility]. Patient was directed to the emergency room with complaints of shortness of breath , heart racing, chest and back pain. Denied pain in his neck or arm.  Past Medical History:  Diagnosis Date  . Anxiety   . Asthma   . 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 Syncope 03/28/2016   Observations/Objective: Review of Systems  Respiratory: Positive for cough, shortness of breath and wheezing.   Cardiovascular: Positive for chest pain and palpitations.  Musculoskeletal: Positive for back pain.  Psychiatric/Behavioral: The patient is nervous/anxious.   All other systems reviewed and are negative.  Assessment and Plan: Derek Smith was seen today for establish care.  Diagnoses and all orders for this visit:  Non-seasonal allergic rhinitis, unspecified trigger -     cetirizine (ZYRTEC) 10 MG tablet; Take 1 tablet (10 mg total) by mouth every morning.  Moderate persistent asthma, unspecified whether complicated Asthma is including cough, wheeze, and shortness of breath brought on by characteristic triggers and relieved by bronchodilating medications.  Refill medications -     montelukast (SINGULAIR) 10 MG tablet; Take 1 tablet (10 mg total) by mouth at bedtime. -     budesonide-formoterol (SYMBICORT) 160-4.5 MCG/ACT inhaler; Inhale 2 puffs into the  lungs 2 (two) times daily.  Anxiety We discussed options for treatment of anxiety including therapy and/or medication will follow up after ER visit for Chest Pain  Chest pain on breathing Directed to ED differential diagnosis - COVID, bronchitis, pneumonia or respiratory infection  Non-seasonal allergic rhinitis, unspecified trigger  Use Flonase nasal spray for at least duration of your allergy season.  - For appropriate administration of the nasal spray, clear the nose, use opposite hand for opposite nare, sniff gently, exhale through your mouth. - For maximal effect take these two nasal sprays at least 30 minutes apart. - Continue Allegra, Claritin or Zyrtec each day, as needed. - Drink at least 64 ounces of water each day. - If you have a humidifier use it nightly. - Remove as many irritants/allergies as you are able to, no pets in the bedroom, change air filters in air vents.  Moderate persistent asthma, unspecified whether complicated  Anxiety  Chest pain on breathing    Follow Up Instructions:    I discussed the assessment and treatment plan with the patient. The patient was provided an opportunity to ask questions and all were answered. The patient agreed with the plan and demonstrated an understanding of the instructions.   The patient was advised to call back or seek an in-person evaluation if the symptoms worsen or if the condition fails to improve as anticipated.  I provided 20 minutes of non-face-to-face time during this encounter.   Kerin Perna, NP

## 2019-02-15 MED ORDER — CETIRIZINE HCL 10 MG PO TABS: 10.0000 mg | ORAL_TABLET | ORAL | 3 refills | Status: DC

## 2019-02-15 MED ORDER — BUDESONIDE-FORMOTEROL FUMARATE 160-4.5 MCG/ACT IN AERO: 2.0000 | INHALATION_SPRAY | Freq: Two times a day (BID) | RESPIRATORY_TRACT | 6 refills | Status: DC

## 2019-02-15 MED ORDER — MONTELUKAST SODIUM 10 MG PO TABS: 10.0000 mg | ORAL_TABLET | Freq: Every day | ORAL | 6 refills | Status: DC

## 2019-02-24 ENCOUNTER — Other Ambulatory Visit: Payer: Self-pay

## 2019-02-24 DIAGNOSIS — Z20822 Contact with and (suspected) exposure to covid-19: Secondary | ICD-10-CM

## 2019-02-25 LAB — NOVEL CORONAVIRUS, NAA: SARS-CoV-2, NAA: NOT DETECTED

## 2019-02-26 ENCOUNTER — Telehealth: Payer: Self-pay

## 2019-02-26 NOTE — Telephone Encounter (Signed)
Caller given negative result with interpreter service and verbalized understanding

## 2019-04-03 IMAGING — CR DG THORACIC SPINE 2V
2 series · 2 of 2 positions shown · non-contrast
Comparison: None.

CLINICAL DATA: 46-year-old male with a history of Chronic lbp

EXAM:
THORACIC SPINE 2 VIEWS

[t-spine ap]
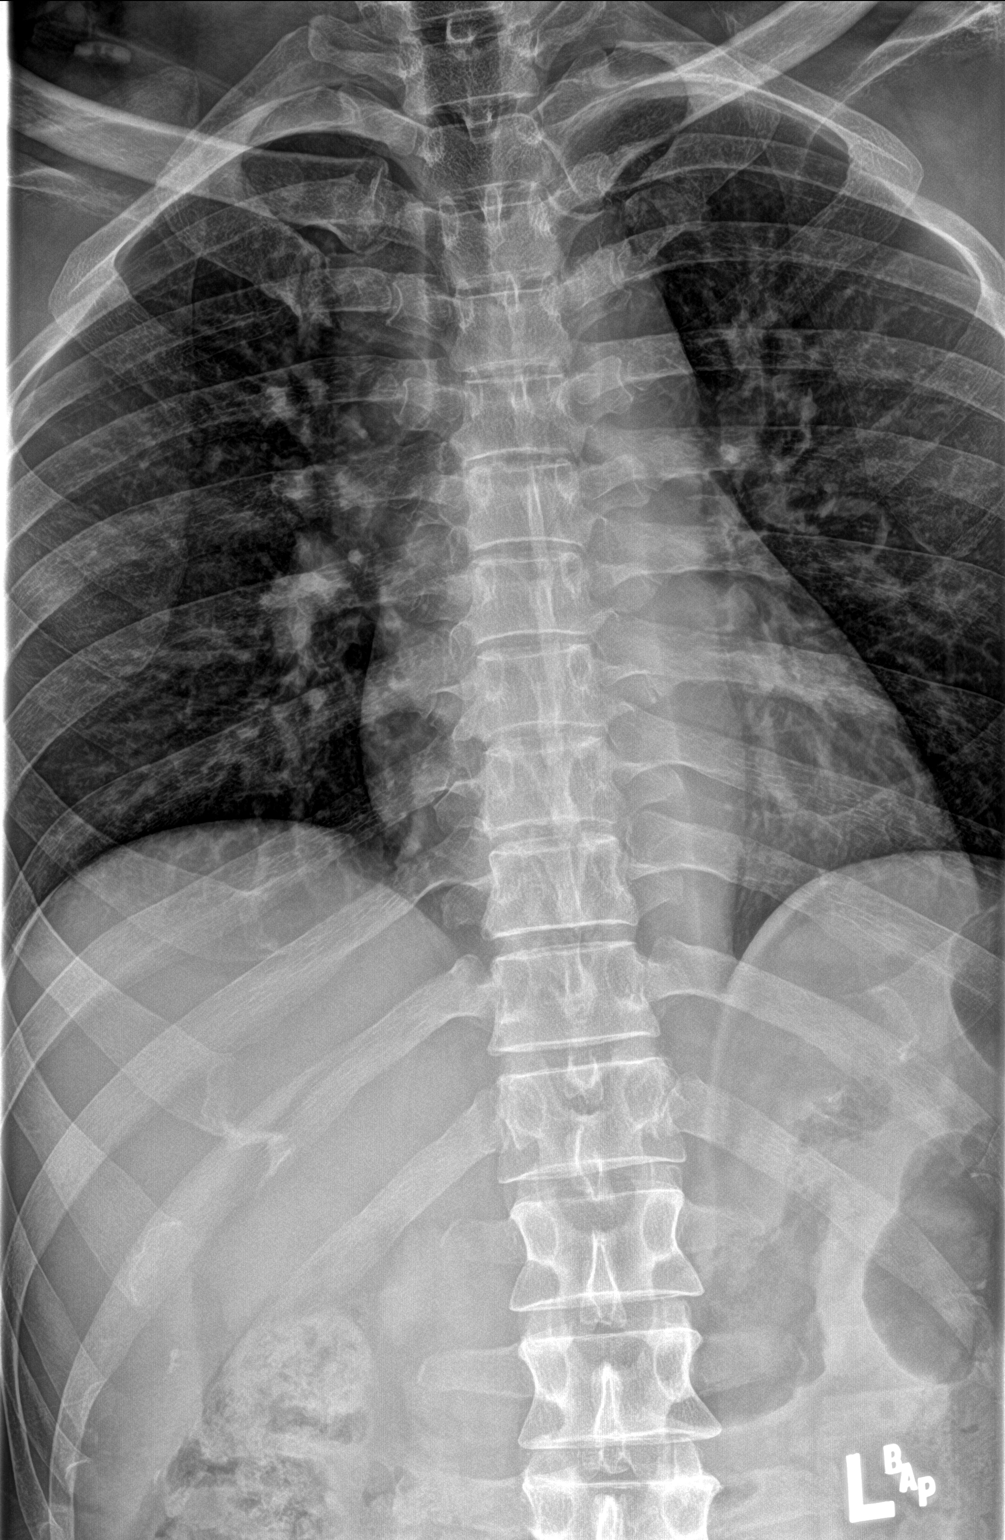

[t-spine lat]
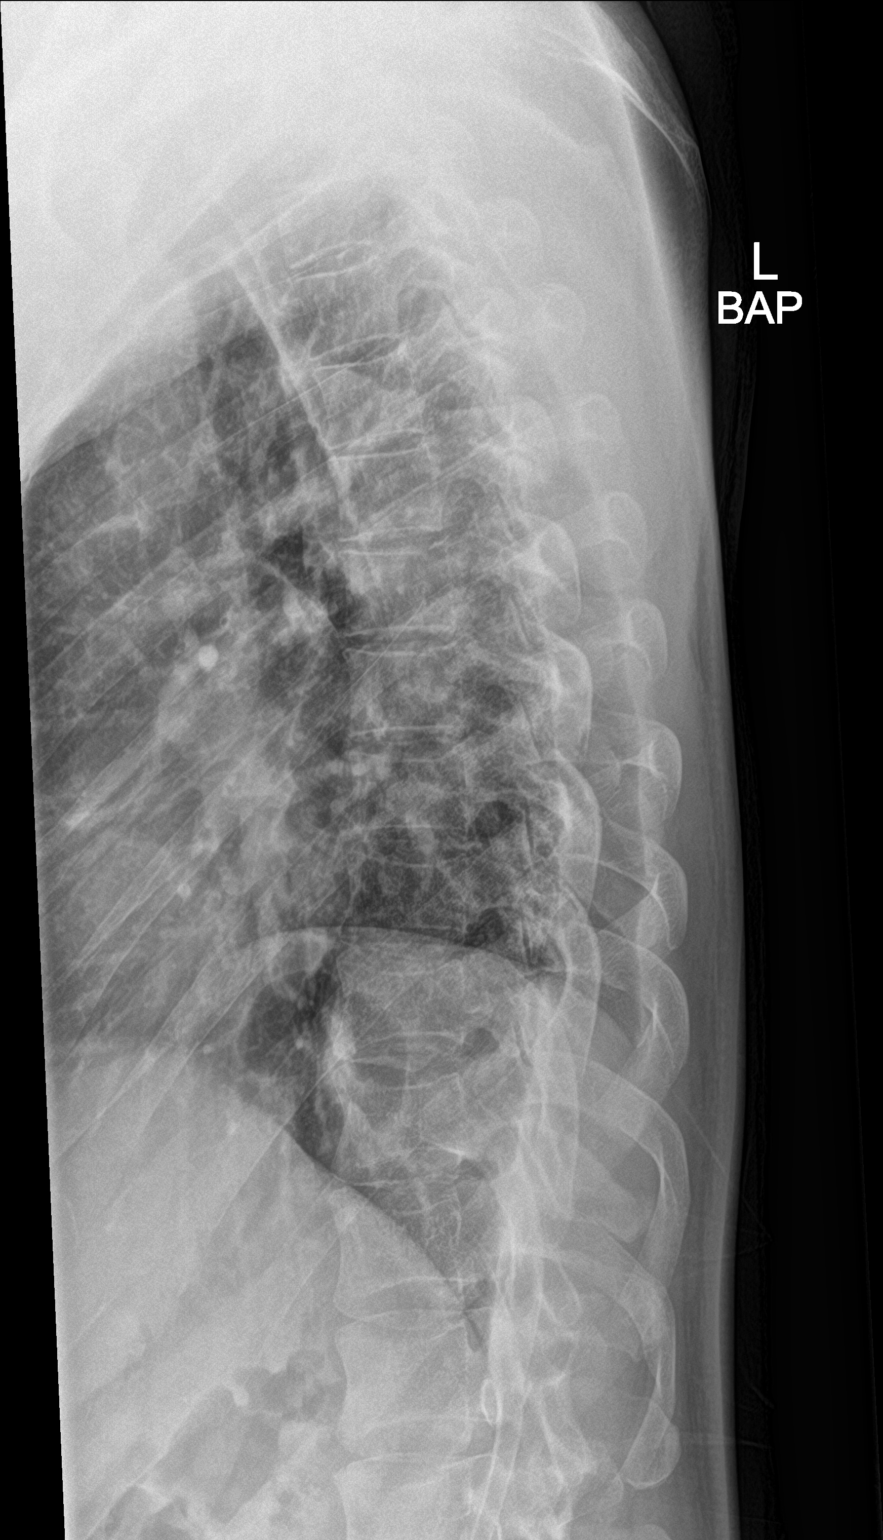

[2 of 2 positions shown; findings below may reference images not displayed]

FINDINGS: Thoracic Spine:

Thoracic vertebral elements maintain normal anatomic alignment, with
no evidence of anterolisthesis, retrolisthesis, or subluxation.

No acute fracture line identified.

Vertebral body heights maintained.

No significant endplate changes or facet disease.

Unremarkable appearance of the visualized thorax.
IMPRESSION: No radiographic evidence of acute fracture malalignment of the
thoracic spine.

## 2019-04-03 IMAGING — CR DG LUMBAR SPINE COMPLETE 4+V
5 series · 5 of 5 positions shown · non-contrast
Comparison: None.

CLINICAL DATA: Chronic left low back pain.

EXAM:
LUMBAR SPINE - COMPLETE 4+ VIEW

[l-spine ap]
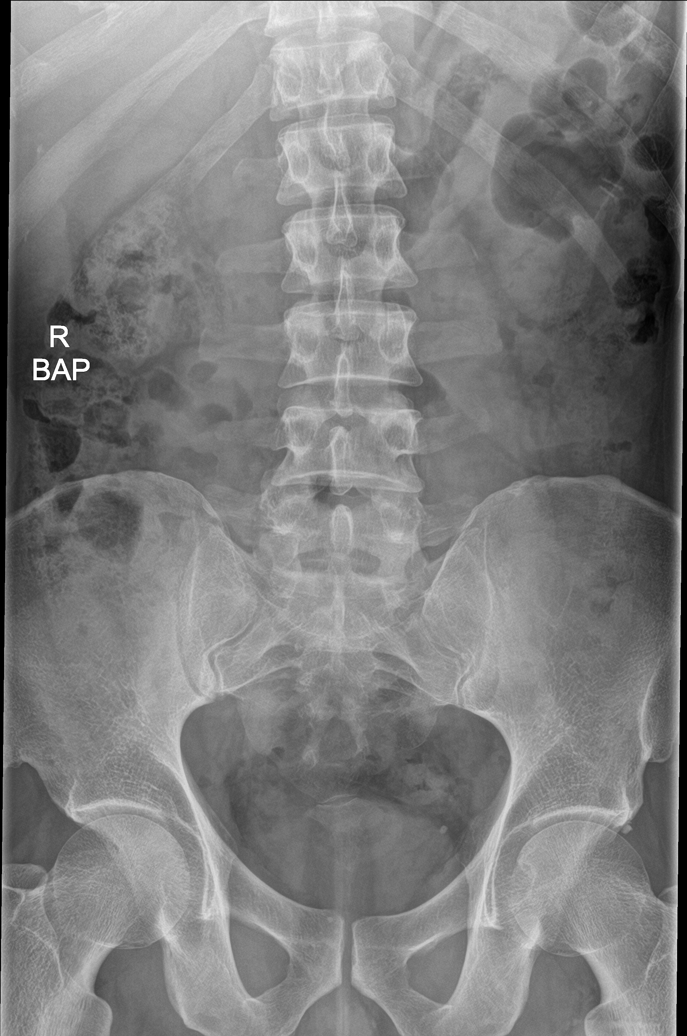

[l-spine obl (1 of 2)]
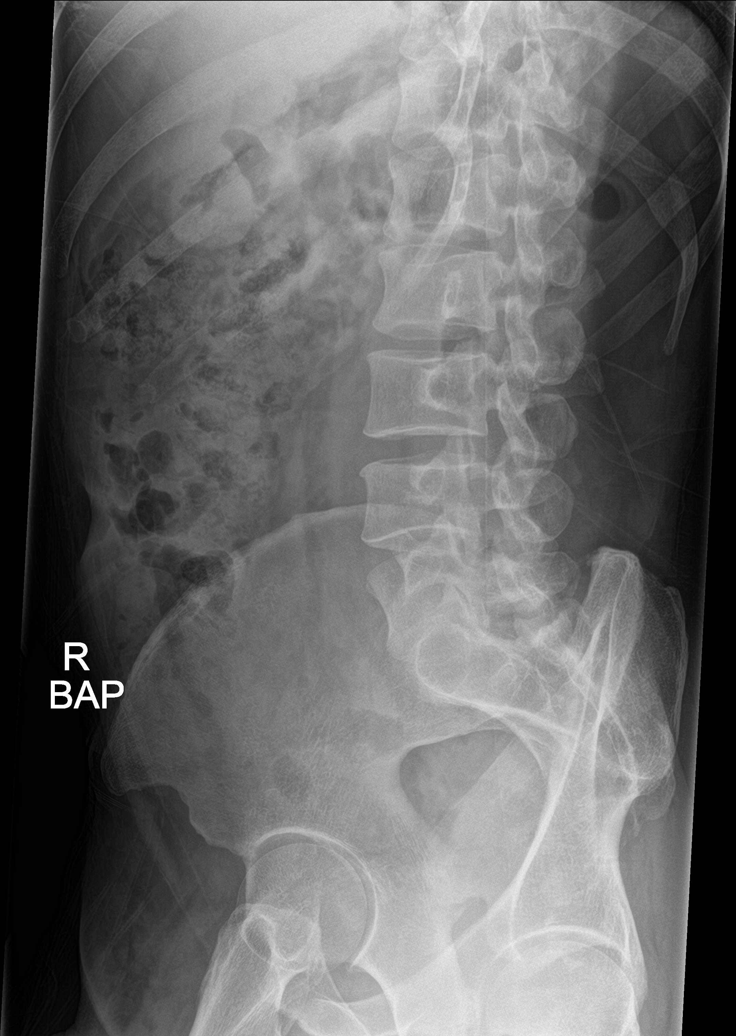

[l-spine lat]
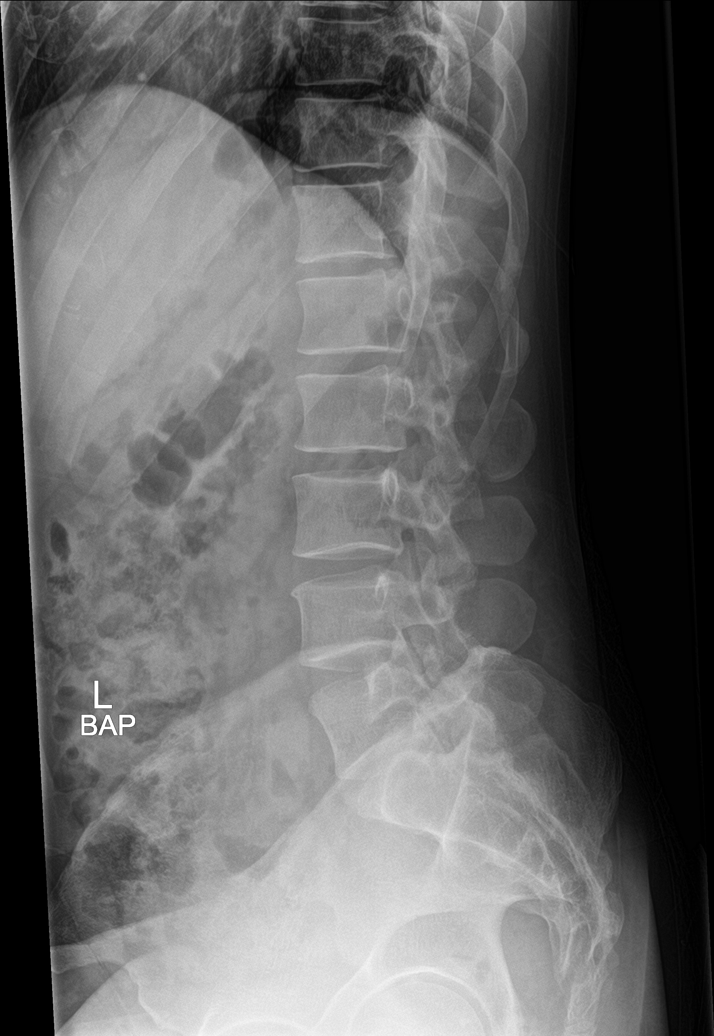

[l-spine spot]
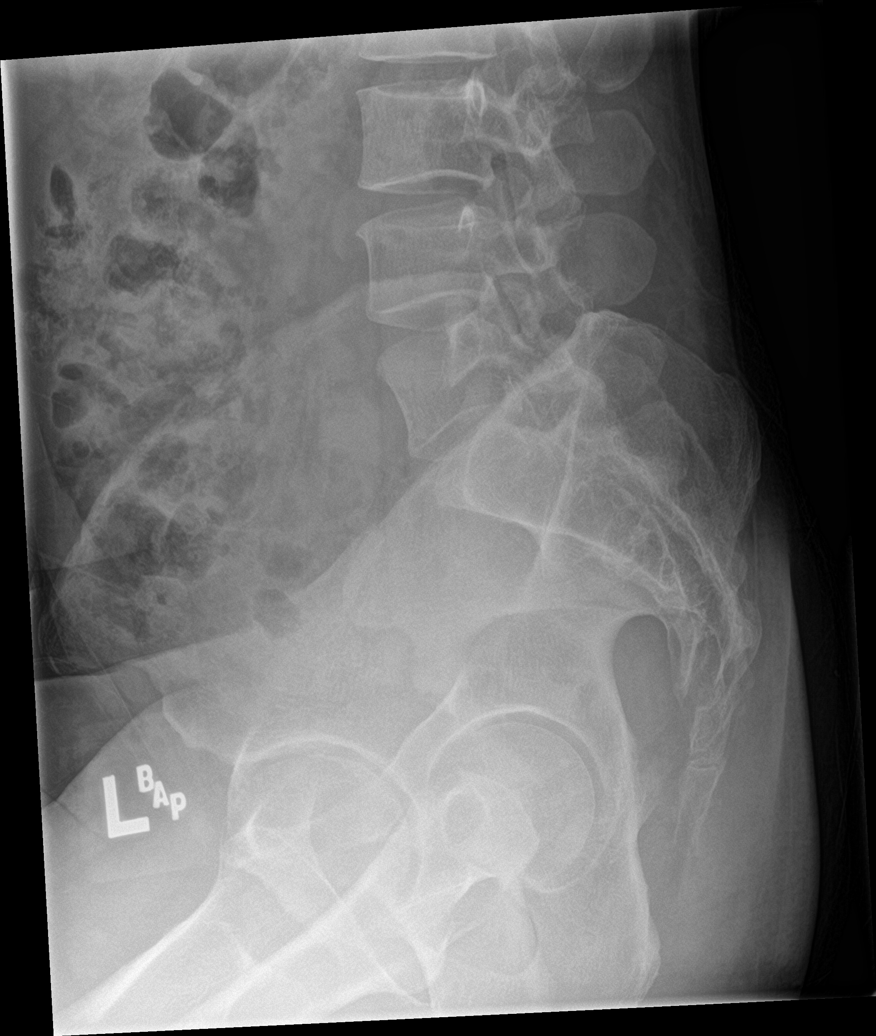

[l-spine obl (2 of 2)]
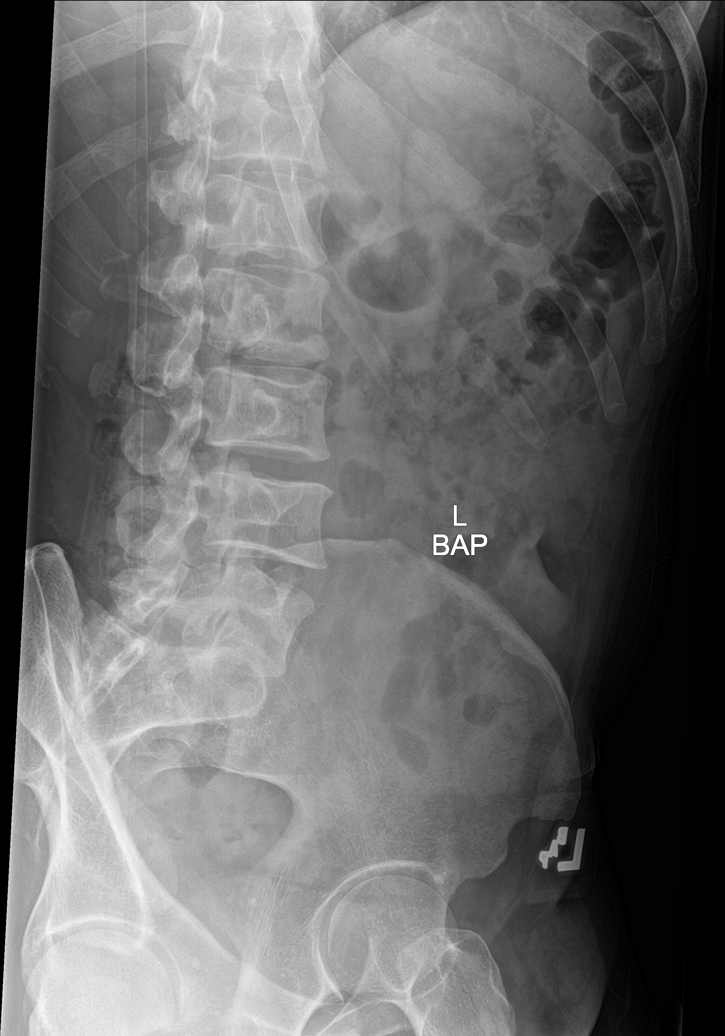

[5 of 5 positions shown; findings below may reference images not displayed]

FINDINGS: There is no evidence of lumbar spine fracture. There is mild
scoliosis of spine. Intervertebral disc spaces are maintained.
IMPRESSION: Mild scoliosis of spine. No significant degenerative joint changes
noted.

## 2019-07-14 ENCOUNTER — Ambulatory Visit: Payer: Self-pay | Attending: Primary Care

## 2019-07-14 ENCOUNTER — Other Ambulatory Visit: Payer: Self-pay

## 2019-07-31 ENCOUNTER — Telehealth: Payer: Self-pay | Admitting: Primary Care

## 2019-07-31 NOTE — Telephone Encounter (Signed)
Called patient in regards to an email I received from the hospitals [employer] Eligibility determination member. Pt understood that his acct statements for the accts ending in .Marland KitchenMarland Kitchen5772 and .Marland KitchenMarland Kitchen2495 are needed before 04/06/2019. Pt understood and agreed to drop them off before this deadline and the complete statements.

## 2019-08-04 ENCOUNTER — Telehealth: Payer: Self-pay | Admitting: Primary Care

## 2019-08-04 NOTE — Telephone Encounter (Signed)
Spoke to patient in regards to his financial assistance application. Pt submitted Missing bank statements for acct ending in 5772 and associated acct 5991. Pt states his bank does not recognize acct ending in 2495 which is shown in his tax return. His refund was deposited directly into acct 5772 on 05/06/2019. Information submitted to hospital for review. Pt had no further question or concerns.

## 2019-08-14 ENCOUNTER — Encounter (INDEPENDENT_AMBULATORY_CARE_PROVIDER_SITE_OTHER): Payer: Self-pay | Admitting: Primary Care

## 2019-08-14 ENCOUNTER — Telehealth (INDEPENDENT_AMBULATORY_CARE_PROVIDER_SITE_OTHER): Payer: Medicaid Other | Admitting: Primary Care

## 2019-08-14 ENCOUNTER — Other Ambulatory Visit: Payer: Self-pay

## 2019-08-14 DIAGNOSIS — M545 Low back pain, unspecified: Secondary | ICD-10-CM

## 2019-08-14 DIAGNOSIS — F419 Anxiety disorder, unspecified: Secondary | ICD-10-CM

## 2019-08-14 DIAGNOSIS — K219 Gastro-esophageal reflux disease without esophagitis: Secondary | ICD-10-CM

## 2019-08-14 DIAGNOSIS — Z760 Encounter for issue of repeat prescription: Secondary | ICD-10-CM

## 2019-08-14 MED ORDER — IBUPROFEN 600 MG PO TABS: 600.0000 mg | ORAL_TABLET | Freq: Three times a day (TID) | ORAL | 0 refills | Status: DC | PRN

## 2019-08-14 MED ORDER — OMEPRAZOLE 20 MG PO CPDR: 20.0000 mg | DELAYED_RELEASE_CAPSULE | Freq: Every day | ORAL | 3 refills | Status: DC

## 2019-08-14 MED ORDER — HYDROXYZINE HCL 25 MG PO TABS: 25.0000 mg | ORAL_TABLET | Freq: Every day | ORAL | 1 refills | Status: DC

## 2019-08-14 NOTE — Progress Notes (Signed)
Stands a lot for work now has pain in right leg Pain is warm and tingling also causing pain in back  Sometimes pain is so strong he is unable to stand

## 2019-08-15 ENCOUNTER — Telehealth: Payer: Self-pay | Admitting: Primary Care

## 2019-08-15 NOTE — Telephone Encounter (Signed)
Patient came in to check on the status of his CAFA. Please f/u

## 2019-08-25 NOTE — Telephone Encounter (Signed)
Pt was inform that is was Denied since Secretary is too high, and he can reaplly on 01/16/20

## 2019-08-31 NOTE — Progress Notes (Addendum)
Virtual Visit via Telephone Note  I connected with Derek Smith on 08/31/19 at  3:50 PM EDT by telephone and verified that I am speaking with the correct person using two identifiers.   I discussed the limitations, risks, security and privacy concerns of performing an evaluation and management service by telephone and the availability of in person appointments. I also discussed with the patient that there may be a patient responsible charge related to this service. The patient expressed understanding and agreed to proceed.  Patient location is home Derek Mire, NP location [facility] History of Present Illness: Derek Smith is a 49 year old male having a tele visit has insurance now and would like to be referred to orthopedic for back and leg pain. Agreed with this request with the concerns he has when he   stands a lot for work now has pain in right leg warm and tingling also causing pain in back. Explain he had Medicaid family planning and does not cover any visits other than annual physical nor medication. He was thank-ful this was explained to him and decided he could wait to see a specialist.  . Past Medical History:  Diagnosis Date  . Anxiety   . Asthma   . 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 Syncope 03/28/2016   Current Outpatient Medications on File Prior to Visit  Medication Sig Dispense Refill  . albuterol (PROVENTIL HFA;VENTOLIN HFA) 108 (90 Base) MCG/ACT inhaler Inhale 2 puffs into the lungs every 6 (six) hours as needed for wheezing or shortness of breath. (Patient not taking: Reported on 08/14/2019) 1 Inhaler 6  . budesonide-formoterol (SYMBICORT) 160-4.5 MCG/ACT inhaler Inhale 2 puffs into the lungs 2 (two) times daily. (Patient not taking: Reported on 08/14/2019) 1 Inhaler 6  . cetirizine (ZYRTEC) 10 MG tablet Take 1 tablet (10 mg total) by mouth every morning. (Patient not taking: Reported on 08/14/2019) 30 tablet 3  .  gabapentin (NEURONTIN) 300 MG capsule Take 300 mg by mouth 3 (three) times daily.    . montelukast (SINGULAIR) 10 MG tablet Take 1 tablet (10 mg total) by mouth at bedtime. (Patient not taking: Reported on 08/14/2019) 30 tablet 6   No current facility-administered medications on file prior to visit.   Observations/Objective: Review of Systems  Gastrointestinal: Positive for heartburn.  Musculoskeletal: Positive for back pain.       Pain in his  right leg Pain is warm and tingling also causing pain in back  so strong he is unable to stand     Assessment and Plan: Tou was seen today for leg pain.  Diagnoses and all orders for this visit:  Gastroesophageal reflux disease without esophagitis Discussed eating small frequent meal, reduction in acidic foods, fried foods ,spicy foods, alcohol caffeine and tobacco and certain medications. Avoid laying down after eating 61mins-1hour, elevated head of the bed. omeprazole (PRILOSEC) 20 MG capsule; Take 1 capsule (20 mg total) by mouth daily.  Anxiety Managed with -     hydrOXYzine (ATARAX/VISTARIL) 25 MG tablet; Take 1 tablet (25 mg total) by mouth at bedtime.  Right low back pain, unspecified chronicity, unspecified whether sciatica present Discussed if any increase in pain and numbness that can be an emergency and present to ED. If not he may alternate with heat and ice application for pain relief.  Ibuprofen prescribed pain relief.   Other orders/Medication refill -     omeprazole (PRILOSEC) 20 MG capsule; Take 1 capsule (20 mg total)  by mouth daily. -     ibuprofen (ADVIL) 600 MG tablet; Take 1 tablet (600 mg total) by mouth every 8 (eight) hours as needed.   Follow Up Instructions:    I discussed the assessment and treatment plan with the patient. The patient was provided an opportunity to ask questions and all were answered. The patient agreed with the plan and demonstrated an understanding of the instructions.   The patient was  advised to call back or seek an in-person evaluation if the symptoms worsen or if the condition fails to improve as anticipated.  I provided 15 minutes of non-face-to-face time during this encounter.   Kerin Perna, NP

## 2020-05-24 NOTE — Addendum Note (Signed)
Addended by: Kerin Perna on: 05/24/2020 10:32 PM   Modules accepted: Level of Service

## 2020-07-13 ENCOUNTER — Ambulatory Visit (INDEPENDENT_AMBULATORY_CARE_PROVIDER_SITE_OTHER): Payer: 59 | Admitting: Primary Care

## 2020-07-13 ENCOUNTER — Other Ambulatory Visit: Payer: Self-pay

## 2020-07-13 ENCOUNTER — Encounter (INDEPENDENT_AMBULATORY_CARE_PROVIDER_SITE_OTHER): Payer: Self-pay | Admitting: Primary Care

## 2020-07-13 VITALS — BP 120/76 | HR 85 | Temp 97.3°F | Ht 67.0 in | Wt 157.2 lb

## 2020-07-13 DIAGNOSIS — L237 Allergic contact dermatitis due to plants, except food: Secondary | ICD-10-CM

## 2020-07-13 DIAGNOSIS — L299 Pruritus, unspecified: Secondary | ICD-10-CM

## 2020-07-13 DIAGNOSIS — I209 Angina pectoris, unspecified: Secondary | ICD-10-CM | POA: Insufficient documentation

## 2020-07-13 DIAGNOSIS — L239 Allergic contact dermatitis, unspecified cause: Secondary | ICD-10-CM | POA: Diagnosis not present

## 2020-07-13 MED ORDER — HYDROXYZINE HCL 25 MG PO TABS
25.0000 mg | ORAL_TABLET | Freq: Every day | ORAL | 1 refills | Status: DC
  Filled 2020-07-13: qty 90, 90d supply, fill #0

## 2020-07-13 MED ORDER — EPINEPHRINE 0.3 MG/0.3ML IJ SOAJ
0.3000 mg | INTRAMUSCULAR | 1 refills | Status: DC | PRN
  Filled 2020-07-13: qty 2, 30d supply, fill #0

## 2020-07-13 MED ORDER — HYDROXYZINE HCL 10 MG PO TABS
10.0000 mg | ORAL_TABLET | Freq: Four times a day (QID) | ORAL | 1 refills | Status: DC | PRN
  Filled 2020-07-13: qty 30, 8d supply, fill #0

## 2020-07-13 NOTE — Progress Notes (Signed)
  Subjective:       [name] is a 50 y.o. Hispanic male (interpreter used Derek Smith [phone]).  who presents for evaluation of a rash involving the hand and upper body. Rash started 4 days ago. Lesions are petechia , and raised in texture. Rash has changed over time. Rash is pruritic. Associated symptoms: none. Patient denies: abdominal pain, congestion, headache and sore throat. Patient has not had contacts with similar rash. Patient has had new exposures (soaps, lotions, laundry detergents, foods, medications, plants, insects or animals).  Patient consulted with the pharmacist regarding the rash on his hands and what to take purchase over-the-counter medication and he is also on prescribed medication unclear if direct interaction caused a reaction and he still has itching at night that wakes him up requesting a referral for allergist for testing.   Review of Systems Review of Systems  Respiratory: Positive for wheezing.   Skin: Positive for itching and rash.  All other systems reviewed and are negative.   Objective:    BP 120/76 (BP Location: Right Arm, Patient Position: Sitting, Cuff Size: Normal)   Pulse 85   Temp (!) 97.3 F (36.3 C) (Temporal)   Ht 5\' 7"  (1.702 m)   Wt 157 lb 3.2 oz (71.3 kg)   SpO2 96%   BMI 24.62 kg/m  Vitals:   07/13/20 1602  BP: 120/76  Pulse: 85  Temp: (!) 97.3 F (36.3 C)  TempSrc: Temporal  SpO2: 96%  Weight: 157 lb 3.2 oz (71.3 kg)  Height: 5\' 7"  (1.702 m)   General: Vital signs reviewed.  Patient is well-developed and well-nourished, male in no acute distress and cooperative with exam.  Head: Normocephalic and atraumatic. Eyes: EOMI, conjunctivae normal, no scleral icterus.  Neck: Supple, trachea midline, normal ROM, no JVD, masses, thyromegaly, or carotid bruit present.  Cardiovascular: RRR, S1 normal, S2 normal, no murmurs, gallops, or rubs. Pulmonary/Chest: Clear to auscultation bilaterally, no wheezes, rales, or rhonchi. Abdominal:  Soft, non-tender, non-distended, BS +, no masses, organomegaly, or guarding present.  Musculoskeletal: No joint deformities, erythema, or stiffness, ROM full and nontender. Extremities: No lower extremity edema bilaterally,  pulses symmetric and intact bilaterally. No cyanosis or clubbing. Neurological: A&O x3, Skin: Warm, dry and intact. rashes and erythema on upper arms Psychiatric: Normal mood and affect.  Assessment:  Jas was seen today for allergic reaction.  Diagnoses and all orders for this visit:  Pruritus Reaction local arms petechia  raise red unknown etiology-      hydrOXYzine (ATARAX/VISTARIL) 10 MG tablet; Take 1 tablet (10 mg total) by mouth every 6 (six) hours as needed for itching.   Allergic contact dermatitis due to plants, except food Prescription sent in for antihistamine to take daily.  Discussed anaphylactic action and the need for EpiPen's and keep Benadryl 25 mg with him.  Verbalizes understanding -     Ambulatory referral to Allergy  Allergic contact dermatitis, unspecified trigger -     Ambulatory referral to Allergy  Other orders -     EPINEPHrine (EPIPEN 2-PAK) 0.3 mg/0.3 mL IJ SOAJ injection; Inject 0.3 mg into the muscle as needed for anaphylaxis.  Juluis Mire Np

## 2020-07-14 ENCOUNTER — Other Ambulatory Visit: Payer: Self-pay

## 2020-07-23 ENCOUNTER — Ambulatory Visit: Payer: 59

## 2020-09-27 ENCOUNTER — Other Ambulatory Visit: Payer: Self-pay

## 2020-09-27 ENCOUNTER — Encounter: Payer: Self-pay | Admitting: Allergy

## 2020-09-27 ENCOUNTER — Ambulatory Visit (INDEPENDENT_AMBULATORY_CARE_PROVIDER_SITE_OTHER): Payer: 59 | Admitting: Allergy

## 2020-09-27 VITALS — BP 118/74 | HR 73 | Temp 98.3°F | Resp 16 | Ht 67.0 in | Wt 157.4 lb

## 2020-09-27 DIAGNOSIS — R079 Chest pain, unspecified: Secondary | ICD-10-CM | POA: Diagnosis not present

## 2020-09-27 DIAGNOSIS — R21 Rash and other nonspecific skin eruption: Secondary | ICD-10-CM | POA: Diagnosis not present

## 2020-09-27 DIAGNOSIS — T50995D Adverse effect of other drugs, medicaments and biological substances, subsequent encounter: Secondary | ICD-10-CM | POA: Insufficient documentation

## 2020-09-27 DIAGNOSIS — J45909 Unspecified asthma, uncomplicated: Secondary | ICD-10-CM

## 2020-09-27 NOTE — Assessment & Plan Note (Signed)
On a different note, patient mentions chest pains and headaches. He does have history of GERD.  Advised patient to follow up with PCP regarding this complaint.

## 2020-09-27 NOTE — Patient Instructions (Addendum)
Rash: Most likely the rash was either from the infection or the cipro.  Continue to avoid cipro for now.  If interested we can schedule drug challenge to cipro. You must be off antihistamines for 3-5 days before. Must be in good health and not ill. Plan on being in the office for 2-3 hours and must bring in the drug you want to do the oral challenge for - will send in prescription to pick up a few days before. You must call to schedule an appointment and specify it's for a drug challenge.   If this happens again - take pictures and write down what you had come across/eaten that day. Take zyrtec 10mg  twice a day at first onset of itchy rash.   Asthma: Daily controller medication(s): Symbicort 150mcg 2 puffs once a day and rinse mouth after each use.  During upper respiratory infections/asthma flares: start Symbicort 157mcg 2 puffs twice a day for 1-2 weeks until your breathing symptoms return to baseline.  May use albuterol rescue inhaler 2 puffs every 4 to 6 hours as needed for shortness of breath, chest tightness, coughing, and wheezing. May use albuterol rescue inhaler 2 puffs 5 to 15 minutes prior to strenuous physical activities. Monitor frequency of use.  Asthma control goals:  Full participation in all desired activities (may need albuterol before activity) Albuterol use two times or less a week on average (not counting use with activity) Cough interfering with sleep two times or less a month Oral steroids no more than once a year No hospitalizations  Follow up with PCP regarding the headaches, chest pains.  Follow in 6 months or sooner if needed.   Skin care recommendations  Bath time: Always use lukewarm water. AVOID very hot or cold water. Keep bathing time to 5-10 minutes. Do NOT use bubble bath. Use a mild soap and use just enough to wash the dirty areas. Do NOT scrub skin vigorously.  After bathing, pat dry your skin with a towel. Do NOT rub or scrub the  skin.  Moisturizers and prescriptions:  ALWAYS apply moisturizers immediately after bathing (within 3 minutes). This helps to lock-in moisture. Use the moisturizer several times a day over the whole body. Good summer moisturizers include: Aveeno, CeraVe, Cetaphil. Good winter moisturizers include: Aquaphor, Vaseline, Cerave, Cetaphil, Eucerin, Vanicream. When using moisturizers along with medications, the moisturizer should be applied about one hour after applying the medication to prevent diluting effect of the medication or moisturize around where you applied the medications. When not using medications, the moisturizer can be continued twice daily as maintenance.  Laundry and clothing: Avoid laundry products with added color or perfumes. Use unscented hypo-allergenic laundry products such as Tide free, Cheer free & gentle, and All free and clear.  If the skin still seems dry or sensitive, you can try double-rinsing the clothes. Avoid tight or scratchy clothing such as wool. Do not use fabric softeners or dyer sheets.

## 2020-09-27 NOTE — Assessment & Plan Note (Signed)
Broke out in a pruritic rash 3 months ago which took about 1 week to resolve. He had URI symptoms for which he took 1 dose of prednisone and cipro 500mg  - the following day he broke out in a rash. He took these meds before with no issues. Denies any other changes in diet, personal care products. . Based on clinical history, the rash was either caused by the infection or cipro. Less likely due to prednisone.  . Continue to avoid cipro for now. . If interested we can schedule drug challenge to cipro. You must be off antihistamines for 3-5 days before. Must be in good health and not ill. Plan on being in the office for 2-3 hours and must bring in the drug you want to do the oral challenge for - will send in prescription to pick up a few days before. You must call to schedule an appointment and specify it's for a drug challenge.  . If this happens again - take pictures and write down what you had come across/eaten that day. . Take zyrtec 10mg  twice a day at first onset of itchy rash.

## 2020-09-27 NOTE — Progress Notes (Signed)
Follow up Note  RE: Derek Smith MRN: [id number] DOB: 1970-07-02 Date of Office Visit: 09/27/2020  Consult requested by: Kerin Perna, NP Primary care provider: Kerin Perna, NP  Chief Complaint: Rash  History of Present Illness: I had the pleasure of seeing Derek Smith for re-evaluation at the [facility] [place] on 09/27/2020. He is a 50 y.o. male, who is re-referred here by Kerin Perna, NP for the evaluation of rash this time. Spanish interpreter present in person.   Rash started about 3 months ago. This rash occurred from the neck down. Describes them as red, itchy, some were raised and some were flat. This lasted about 1 week. No ecchymosis upon resolution. Associated symptoms include: none. Suspected triggers are unknown but he took prednisone, and cipro 500mg  for an upper respiratory infection and he broke out the following day.  Patient took this combination of medications before with no issues. He has not had these medications since then.   Patient had negative covid-19 testing. Denies any fevers, chills, foods, personal care products. He has tried the following therapies: benadryl with good benefit. Previous history of rash/hives: no. Patient is up to date with the following cancer screening tests: physical exam.  07/13/2020 PCP visit: "Derek Smith is a 50 y.o. Hispanic male (interpreter used Learta Codding [phone]).  who presents for evaluation of a rash involving the hand and upper body. Rash started 4 days ago. Lesions are petechia , and raised in texture. Rash has changed over time. Rash is pruritic. Associated symptoms: none. Patient denies: abdominal pain, congestion, headache and sore throat. Patient has not had contacts with similar rash. Patient has had new exposures (soaps, lotions, laundry detergents, foods, medications, plants, insects or animals).  Patient consulted with the pharmacist regarding the rash on his hands and what  to take purchase over-the-counter medication and he is also on prescribed medication unclear if direct interaction caused a reaction and he still has itching at night that wakes him up requesting a referral for allergist for testing."  Assessment and Plan: Neizan is a 50 y.o. male with: Rash and other nonspecific skin eruption Broke out in a pruritic rash 3 months ago which took about 1 week to resolve. He had URI symptoms for which he took 1 dose of prednisone and cipro 500mg  - the following day he broke out in a rash. He took these meds before with no issues. Denies any other changes in diet, personal care products. Based on clinical history, the rash was either caused by the infection or cipro. Less likely due to prednisone.  Continue to avoid cipro for now. If interested we can schedule drug challenge to cipro. You must be off antihistamines for 3-5 days before. Must be in good health and not ill. Plan on being in the office for 2-3 hours and must bring in the drug you want to do the oral challenge for - will send in prescription to pick up a few days before. You must call to schedule an appointment and specify it's for a drug challenge.  If this happens again - take pictures and write down what you had come across/eaten that day. Take zyrtec 10mg  twice a day at first onset of itchy rash.  Asthma Past history - diagnosed with asthma many years ago. Interim history - uses Symbicort prn once a day due to symptoms. Today's spirometry was normal. Daily controller medication(s): Symbicort 117mcg 2 puffs once a day and rinse mouth after each use.  During upper respiratory infections/asthma flares: start Symbicort 163mcg 2 puffs twice a day for 1-2 weeks until your breathing symptoms return to baseline.  May use albuterol rescue inhaler 2 puffs every 4 to 6 hours as needed for shortness of breath, chest tightness, coughing, and wheezing. May use albuterol rescue inhaler 2 puffs 5 to 15 minutes prior to  strenuous physical activities. Monitor frequency of use.   Chest pain On a different note, patient mentions chest pains and headaches. He does have history of GERD. Advised patient to follow up with PCP regarding this complaint.  Return in about 6 months (around 03/30/2021).  No orders of the defined types were placed in this encounter.  Lab Orders  No laboratory test(s) ordered today    Other allergy screening: Asthma: yes ACT score 18. Takes albuterol prn and Symbicort 172mcg as needed only but requiring Symbicort once a day.  Rhino conjunctivitis: no Food allergy: no Hymenoptera allergy: no History of recurrent infections suggestive of immunodeficency: no  Diagnostics: Spirometry:  Tracings reviewed. His effort: Good reproducible efforts. FVC: 3.77L FEV1: 3.04L, 91% predicted FEV1/FVC ratio: 81% Interpretation: Spirometry consistent with normal pattern.  Please see scanned spirometry results for details.  Past Medical History: Patient Active Problem List   Diagnosis Date Noted   Adverse effect of other drugs, medicaments and biological substances, subsequent encounter 09/27/2020   Asthma 09/27/2020   Rash and other nonspecific skin eruption 09/27/2020   Chest pain 09/27/2020   Angina pectoris ([place]) 07/13/2020   Gastroesophageal reflux disease    Anxiety 04/11/2018   Coughing 02/25/2018   Vomiting 02/25/2018   Moderate persistent asthma without complication 61/60/7371   Precordial chest pain    Syncope 03/28/2016   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    Syncope 03/28/2016   Past Surgical History: Past Surgical History:  Procedure Laterality Date   39 HOUR [place] STUDY N/A 05/08/2018   Procedure: 24 HOUR PH STUDY;  Surgeon: Mauri Pole, MD;  Location: [facility];  Service: Endoscopy;  Laterality: N/A;   ESOPHAGEAL MANOMETRY N/A 05/08/2018   Procedure: ESOPHAGEAL MANOMETRY (EM);   Surgeon: Mauri Pole, MD;  Location: [facility];  Service: Endoscopy;  Laterality: N/A;   LEFT HEART CATH AND CORONARY ANGIOGRAPHY N/A 05/22/2016   Procedure: Left Heart Cath and Coronary Angiography;  Surgeon: Jettie Booze, MD;  Location: [facility];  Service: Cardiovascular;  Laterality: N/A;   Medication List:  Current Outpatient Medications  Medication Sig Dispense Refill   albuterol (PROVENTIL HFA;VENTOLIN HFA) 108 (90 Base) MCG/ACT inhaler Inhale 2 puffs into the lungs every 6 (six) hours as needed for wheezing or shortness of breath. 1 Inhaler 6   budesonide-formoterol (SYMBICORT) 160-4.5 MCG/ACT inhaler Inhale 2 puffs into the lungs 2 (two) times daily. 1 Inhaler 6   EPINEPHrine (EPIPEN 2-PAK) 0.3 mg/0.3 mL IJ SOAJ injection Inject 0.3 mg into the muscle as needed for anaphylaxis. 2 each 1   gabapentin (NEURONTIN) 300 MG capsule Take 300 mg by mouth 3 (three) times daily.     hydrOXYzine (ATARAX/VISTARIL) 10 MG tablet Take 1 tablet (10 mg total) by mouth every 6 (six) hours as needed for itching. 30 tablet 1   omeprazole (PRILOSEC) 20 MG capsule Take 1 capsule (20 mg total) by mouth daily. 90 capsule 3   montelukast (SINGULAIR) 10 MG tablet Take 1 tablet (10 mg total) by mouth at bedtime. (  Patient not taking: No sig reported) 30 tablet 6   No current facility-administered medications for this visit.   Allergies: Allergies  Allergen Reactions   Ciprofloxacin     Possible rash   Social History: Social History   Socioeconomic History   Marital status: Married    Spouse name: Not on file   Number of children: Not on file   Years of education: Not on file   Highest education level: Not on file  Occupational History   Not on file  Tobacco Use   Smoking status: Never    Passive exposure: Current   Smokeless tobacco: Never  Vaping Use   Vaping Use: Never used  Substance and Sexual Activity   Alcohol use: No   Drug use: No   Sexual activity: Yes   Other Topics Concern   Not on file  Social History Narrative   Not on file   Social Determinants of Health   Financial Resource Strain: Not on file  Food Insecurity: Not on file  Transportation Needs: Not on file  Physical Activity: Not on file  Stress: Not on file  Social Connections: Not on file   Lives in an apartment. Smoking: denies Occupation: works with Environmental education officer HistoryFreight forwarder in the house: no Charity fundraiser in the family room: yes Carpet in the bedroom: yes Heating: electric Cooling: central Pet: no  Family History: Family History  Problem Relation Age of Onset   GI problems Mother    Ovarian cancer Mother    Allergic rhinitis Neg Hx    Eczema Neg Hx    Asthma Neg Hx    Urticaria Neg Hx    Pancreatic cancer Neg Hx    Stomach cancer Neg Hx    Colon cancer Neg Hx    Esophageal cancer Neg Hx    Review of Systems  Constitutional:  Negative for appetite change, chills, fever and unexpected weight change.  HENT:  Negative for congestion and rhinorrhea.   Eyes:  Negative for itching.  Respiratory:  Positive for shortness of breath. Negative for cough, chest tightness and wheezing.   Cardiovascular:  Positive for chest pain.  Gastrointestinal:  Negative for abdominal pain.  Genitourinary:  Negative for difficulty urinating.  Skin:  Negative for rash.  Neurological:  Positive for headaches.   Objective: BP 118/74   Pulse 73   Temp 98.3 F (36.8 C) (Temporal)   Resp 16   Ht 5\' 7"  (1.702 m)   Wt 157 lb 6.4 oz (71.4 kg)   SpO2 97%   BMI 24.65 kg/m  Body mass index is 24.65 kg/m. Physical Exam Vitals and nursing note reviewed.  Constitutional:      Appearance: Normal appearance. He is well-developed.  HENT:     Head: Normocephalic and atraumatic.     Right Ear: External ear normal.     Left Ear: External ear normal.     Nose: Nose normal.     Mouth/Throat:     Mouth: Mucous membranes are moist.     Pharynx: Oropharynx is  clear.  Eyes:     Conjunctiva/sclera: Conjunctivae normal.  Cardiovascular:     Rate and Rhythm: Normal rate and regular rhythm.     Heart sounds: Normal heart sounds. No murmur heard.   No friction rub. No gallop.  Pulmonary:     Effort: Pulmonary effort is normal.     Breath sounds: Normal breath sounds. No wheezing, rhonchi or rales.  Abdominal:     Palpations:  Abdomen is soft.  Musculoskeletal:     Cervical back: Neck supple.  Skin:    General: Skin is warm.     Findings: No rash.  Neurological:     Mental Status: He is alert and oriented to person, place, and time.  Psychiatric:        Behavior: Behavior normal.  The plan was reviewed with the patient/family, and all questions/concerned were addressed.  It was my pleasure to see Derek Smith today and participate in his care. Please feel free to contact me with any questions or concerns.  Sincerely,  Rexene Alberts, DO Allergy & Immunology  [facility] office: [place] office: [phone]

## 2020-09-27 NOTE — Assessment & Plan Note (Signed)
Past history - diagnosed with asthma many years ago. Interim history - uses Symbicort prn once a day due to symptoms.  Today's spirometry was normal. . Daily controller medication(s): Symbicort 173mcg 2 puffs once a day and rinse mouth after each use.  . During upper respiratory infections/asthma flares: start Symbicort 169mcg 2 puffs twice a day for 1-2 weeks until your breathing symptoms return to baseline.  . May use albuterol rescue inhaler 2 puffs every 4 to 6 hours as needed for shortness of breath, chest tightness, coughing, and wheezing. May use albuterol rescue inhaler 2 puffs 5 to 15 minutes prior to strenuous physical activities. Monitor frequency of use.

## 2021-01-11 ENCOUNTER — Other Ambulatory Visit: Payer: Self-pay

## 2021-01-11 ENCOUNTER — Emergency Department (HOSPITAL_COMMUNITY)
Admission: EM | Admit: 2021-01-11 | Discharge: 2021-01-11 | Disposition: A | Discharge status: Home / Self Care | Payer: 59 | Attending: Emergency Medicine | Admitting: Emergency Medicine

## 2021-01-11 ENCOUNTER — Encounter (HOSPITAL_COMMUNITY): Payer: Self-pay | Admitting: Emergency Medicine

## 2021-01-11 ENCOUNTER — Emergency Department (HOSPITAL_COMMUNITY): Payer: 59

## 2021-01-11 DIAGNOSIS — J45909 Unspecified asthma, uncomplicated: Secondary | ICD-10-CM | POA: Diagnosis not present

## 2021-01-11 DIAGNOSIS — R0602 Shortness of breath: Secondary | ICD-10-CM | POA: Diagnosis present

## 2021-01-11 DIAGNOSIS — R079 Chest pain, unspecified: Secondary | ICD-10-CM | POA: Insufficient documentation

## 2021-01-11 DIAGNOSIS — Z20822 Contact with and (suspected) exposure to covid-19: Secondary | ICD-10-CM | POA: Insufficient documentation

## 2021-01-11 DIAGNOSIS — J45901 Unspecified asthma with (acute) exacerbation: Secondary | ICD-10-CM

## 2021-01-11 DIAGNOSIS — F17220 Nicotine dependence, chewing tobacco, uncomplicated: Secondary | ICD-10-CM | POA: Diagnosis not present

## 2021-01-11 LAB — CBC WITH DIFFERENTIAL/PLATELET
Abs Immature Granulocytes: 0.05 10*3/uL (ref 0.00–0.07)
Basophils Absolute: 0 10*3/uL (ref 0.0–0.1)
Basophils Relative: 0 %
Eosinophils Absolute: 0 10*3/uL (ref 0.0–0.5)
Eosinophils Relative: 0 %
HCT: 44.3 % (ref 39.0–52.0)
Hemoglobin: 14.8 g/dL (ref 13.0–17.0)
Immature Granulocytes: 1 %
Lymphocytes Relative: 21 %
Lymphs Abs: 2.2 10*3/uL (ref 0.7–4.0)
MCH: 31.4 pg (ref 26.0–34.0)
MCHC: 33.4 g/dL (ref 30.0–36.0)
MCV: 93.9 fL (ref 80.0–100.0)
Monocytes Absolute: 1 10*3/uL (ref 0.1–1.0)
Monocytes Relative: 10 %
Neutro Abs: 7.1 10*3/uL (ref 1.7–7.7)
Neutrophils Relative %: 68 %
Platelets: 228 10*3/uL (ref 150–400)
RBC: 4.72 MIL/uL (ref 4.22–5.81)
RDW: 11.6 % (ref 11.5–15.5)
WBC: 10.4 10*3/uL (ref 4.0–10.5)
nRBC: 0 % (ref 0.0–0.2)

## 2021-01-11 LAB — TROPONIN I (HIGH SENSITIVITY)
Troponin I (High Sensitivity): 2 ng/L (ref <18)
Troponin I (High Sensitivity): 3 ng/L (ref <18)

## 2021-01-11 LAB — BASIC METABOLIC PANEL
Anion gap: 9 (ref 5–15)
BUN: 16 mg/dL (ref 6–20)
CO2: 28 mmol/L (ref 22–32)
Calcium: 9.3 mg/dL (ref 8.9–10.3)
Chloride: 101 mmol/L (ref 98–111)
Creatinine, Ser: 1.07 mg/dL (ref 0.61–1.24)
GFR, Estimated: >60 mL/min (ref >60)
Glucose, Bld: 119 mg/dL — ABNORMAL HIGH (ref 70–99)
Potassium: 3 mmol/L — ABNORMAL LOW (ref 3.5–5.1)
Sodium: 138 mmol/L (ref 135–145)

## 2021-01-11 LAB — RESP PANEL BY RT-PCR (FLU A&B, COVID) ARPGX2
Influenza A by PCR: NEGATIVE
Influenza B by PCR: NEGATIVE
SARS Coronavirus 2 by RT PCR: NEGATIVE

## 2021-01-11 MED ORDER — PREDNISONE 20 MG PO TABS
40.0000 mg | ORAL_TABLET | Freq: Every day | ORAL | 0 refills | Status: AC
  Filled 2021-01-11: qty 10, 5d supply, fill #0

## 2021-01-11 MED ORDER — LORAZEPAM 1 MG PO TABS
0.5000 mg | ORAL_TABLET | Freq: Once | ORAL | Status: AC
  Administered 2021-01-11: 0.5 mg via ORAL
  Filled 2021-01-11: qty 1

## 2021-01-11 MED ORDER — SODIUM CHLORIDE 0.9 % IV BOLUS
1000.0000 mL | Freq: Once | INTRAVENOUS | Status: AC
  Administered 2021-01-11: 1000 mL via INTRAVENOUS

## 2021-01-11 MED ORDER — ALBUTEROL SULFATE HFA 108 (90 BASE) MCG/ACT IN AERS
1.0000 | INHALATION_SPRAY | Freq: Four times a day (QID) | RESPIRATORY_TRACT | 0 refills | Status: DC | PRN
  Filled 2021-01-11: qty 8.5, 25d supply, fill #0

## 2021-01-11 MED ORDER — POTASSIUM CHLORIDE CRYS ER 20 MEQ PO TBCR
40.0000 meq | EXTENDED_RELEASE_TABLET | Freq: Once | ORAL | Status: AC
  Administered 2021-01-11: 40 meq via ORAL
  Filled 2021-01-11: qty 2

## 2021-01-11 MED ORDER — METHYLPREDNISOLONE SODIUM SUCC 125 MG IJ SOLR: 125.0000 mg | Freq: Every day | INTRAMUSCULAR | Status: DC

## 2021-01-11 MED ORDER — METHYLPREDNISOLONE SODIUM SUCC 125 MG IJ SOLR
80.0000 mg | Freq: Every day | INTRAMUSCULAR | Status: DC
  Administered 2021-01-11: 80 mg via INTRAVENOUS
  Filled 2021-01-11: qty 2

## 2021-01-11 MED ORDER — IPRATROPIUM-ALBUTEROL 0.5-2.5 (3) MG/3ML IN SOLN
3.0000 mL | Freq: Once | RESPIRATORY_TRACT | Status: AC
  Administered 2021-01-11: 3 mL via RESPIRATORY_TRACT
  Filled 2021-01-11: qty 3

## 2021-01-11 MED ORDER — IPRATROPIUM-ALBUTEROL 0.5-2.5 (3) MG/3ML IN SOLN
3.0000 mL | Freq: Once | RESPIRATORY_TRACT | Status: AC
  Administered 2021-01-11: 3 mL via RESPIRATORY_TRACT

## 2021-01-11 MED ORDER — BENZONATATE 100 MG PO CAPS
100.0000 mg | ORAL_CAPSULE | Freq: Three times a day (TID) | ORAL | 0 refills | Status: DC
  Filled 2021-01-11: qty 21, 7d supply, fill #0

## 2021-01-11 NOTE — ED Notes (Signed)
Pt  ambulated on pulseox. SpO2 remained WNL while ambulating. Pt reported feeling dizzy and was unsteady while ambulating. Pt was not hypotensive during this time.

## 2021-01-11 NOTE — ED Notes (Signed)
Pt given sandwich bag and drink.

## 2021-01-11 NOTE — ED Triage Notes (Signed)
Patient with chest pain and shortness of breath, he states that he has asthma.  Patient has pulmicort which he has been using as a rescue inhaler.  Patient can only speak one or two words before coughing.  Patient is congested and coughing up phlegm.

## 2021-01-11 NOTE — Discharge Instructions (Addendum)
At this time there does not appear to be the presence of an emergent medical condition, however there is always the potential for conditions to change. Please read and follow the below instructions.  Please return to the Emergency Department immediately for any new or worsening symptoms. Please be sure to follow up with your Primary Care Provider within one week regarding your visit today; please call their office to schedule an appointment even if you are feeling better for a follow-up visit.  Go to the nearest Emergency Department immediately if: You have fever or chills You seem to be worse and are not responding to medicine during an asthma attack. You are short of breath even at rest. You get short of breath when doing very little activity. You have trouble eating, drinking, or talking. You have chest pain or tightness. You have a fast heartbeat. Your lips or fingernails start to turn blue. You are light-headed or dizzy, or you faint. Your peak flow is less than 50% of your personal best. You feel too tired to breathe normally. You have any new/concerning or worsening of symptoms   Please read the additional information packets attached to your discharge summary.  Do not take your medicine if  develop an itchy rash, swelling in your mouth or lips, or difficulty breathing; call 911 and seek immediate emergency medical attention if this occurs.  You may review your lab tests and imaging results in their entirety on your MyChart account.  Please discuss all results of fully with your primary care provider and other specialist at your follow-up visit.  Note: Portions of this text may have been transcribed using voice recognition software. Every effort was made to ensure accuracy; however, inadvertent computerized transcription errors may still be present.  ---- Below has been translated using Google translate.  Errors may be present.  Interpret with caution.  A continuacin se ha  traducido con Microbiologist. Puede haber errores. Interprete con precaucin. --- En este momento no parece haber la presencia de una condicin [employer], sin embargo, siempre existe la posibilidad de que las condiciones [place] instrucciones a continuacin.  1. Regrese al Departamento de Emergencias de inmediato si presenta sntomas nuevos o que empeoran. 2. Asegrese de hacer un seguimiento con su proveedor de atencin primaria dentro de una semana con respecto a su visita de hoy; llame a su oficina para programar una cita, incluso si se siente mejor para una visita de seguimiento.  Vaya al Departamento de Emergencias ms cercano de inmediato si: ? Tienes fiebre o escalofros  Psychiatrist y no responde a los medicamentos durante un ataque de asma.  Tiene dificultad para respirar incluso en reposo.  Le falta el aire cuando hace muy poca actividad.  Tiene problemas para comer, beber o hablar.  Tiene dolor u opresin en el pecho.  Tiene el corazn acelerado.  Sus labios o uas comienzan a ponerse azules.  Est aturdido o mareado, o se desmaya.  Su flujo mximo es menos del 50% de su mejor nivel personal.  Se siente demasiado cansado para respirar normalmente.  Tiene algn sntoma nuevo/preocupante o que empeora   Lea los paquetes de informacin adicional adjuntos a su resumen de alta.  No tome su medicamento si presenta sarpullido con picazn, hinchazn en la boca o los labios, o dificultad para respirar; llame al 911 y busque atencin mdica de emergencia inmediata si esto ocurre.  Puede revisar sus pruebas de laboratorio y [place] de  imgenes en su totalidad en su cuenta MyChart. Analice todos los resultados de forma completa con su proveedor de atencin primaria y otro especialista en su [place].  Nota: Es posible que partes de este texto se hayan transcrito con un software de Financial trader de voz. Se hizo todo lo posible para  garantizar la precisin; sin embargo, an pueden estar presentes errores de transcripcin computarizados involuntarios.   I am sending you home with albuterol, cough medication, and steroids. Please follow-up with PCP within the next week. Return to the ER for new or worsening symptoms.

## 2021-01-11 NOTE — ED Notes (Signed)
Gave pt something to eat and drink

## 2021-01-11 NOTE — ED Provider Notes (Signed)
[facility] EMERGENCY DEPARTMENT Provider Note   CSN: [id number] Arrival date & time: 01/11/21  0440     History Chief Complaint  Patient presents with   Chest Pain   Shortness of Breath   Spanish video interpreter used during visit. Derek Smith is a 50 y.o. male reports a history of asthma otherwise healthy presented today for shortness of breath.  Patient reports that he works in [employer] and yesterday he was around some bleach as well as concrete dust which he feels caused an asthma exacerbation.  Patient reports he had a coughing fit while around the concrete dust, this was associate with shortness of breath, he was using his home inhaler with some improvement but symptoms worsened towards the end of his shift.  During the night he attempted several times to use his albuterol inhaler without improvement.  He reports nonproductive cough and shortness of breath has been continuous, he reports a mild burning sensation in his chest that only occurs during his cough and improves with rest, it is not associated with exertion.  Patient reports he received a albuterol nebulizer after he got to the ER and his symptoms greatly improved but he feels his breathing is still not back to baseline.  Patient reports that he has been feeling feverish as an associated symptom but has not measured a fever at home he denies any sick contacts.  He denies fall, injury, hemoptysis, history of blood clot, abdominal pain, nausea, vomiting, extremity swelling/color change or recent surgery/immobilizations or any additional concerns.  HPI     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    Syncope 03/28/2016    Patient Active Problem List   Diagnosis Date Noted   Adverse effect of other drugs, medicaments and biological substances, subsequent encounter 09/27/2020   Asthma 09/27/2020   Rash and other nonspecific  skin eruption 09/27/2020   Chest pain 09/27/2020   Angina pectoris ([place]) 07/13/2020   Gastroesophageal reflux disease    Anxiety 04/11/2018   Coughing 02/25/2018   Vomiting 02/25/2018   Moderate persistent asthma without complication 16/60/6301   Precordial chest pain    Syncope 03/28/2016    Past Surgical History:  Procedure Laterality Date   24 HOUR [place] STUDY N/A 05/08/2018   Procedure: [place];  Surgeon: Mauri Pole, MD;  Location: [facility];  Service: Endoscopy;  Laterality: N/A;   ESOPHAGEAL MANOMETRY N/A 05/08/2018   Procedure: ESOPHAGEAL MANOMETRY (EM);  Surgeon: Mauri Pole, MD;  Location: [facility];  Service: Endoscopy;  Laterality: N/A;   LEFT HEART CATH AND CORONARY ANGIOGRAPHY N/A 05/22/2016   Procedure: Left Heart Cath and Coronary Angiography;  Surgeon: Jettie Booze, MD;  Location: [facility];  Service: Cardiovascular;  Laterality: N/A;       Family History  Problem Relation Age of Onset   GI problems Mother    Ovarian cancer Mother    Allergic rhinitis Neg Hx    Eczema Neg Hx    Asthma Neg Hx    Urticaria Neg Hx    Pancreatic cancer Neg Hx    Stomach cancer Neg Hx    Colon cancer Neg Hx    Esophageal cancer Neg Hx     Social History   Tobacco Use   Smoking status: Never    Passive exposure: Current   Smokeless tobacco: Never  Vaping Use  Vaping Use: Never used  Substance Use Topics   Alcohol use: No   Drug use: No    Home Medications Prior to Admission medications   Medication Sig Start Date End Date Taking? Authorizing Provider  albuterol (PROVENTIL HFA;VENTOLIN HFA) 108 (90 Base) MCG/ACT inhaler Inhale 2 puffs into the lungs every 6 (six) hours as needed for wheezing or shortness of breath. 04/11/18   Charlott Rakes, MD  budesonide-formoterol (SYMBICORT) 160-4.5 MCG/ACT inhaler Inhale 2 puffs into the lungs 2 (two) times daily. 02/15/19   Kerin Perna, NP  EPINEPHrine (EPIPEN 2-PAK) 0.3 mg/0.3 mL  IJ SOAJ injection Inject 0.3 mg into the muscle as needed for anaphylaxis. 07/13/20   Kerin Perna, NP  gabapentin (NEURONTIN) 300 MG capsule Take 300 mg by mouth 3 (three) times daily.    [provider]  hydrOXYzine (ATARAX/VISTARIL) 10 MG tablet Take 1 tablet (10 mg total) by mouth every 6 (six) hours as needed for itching. 07/13/20   Kerin Perna, NP  montelukast (SINGULAIR) 10 MG tablet Take 1 tablet (10 mg total) by mouth at bedtime. Patient not taking: No sig reported 02/15/19   Kerin Perna, NP  omeprazole (PRILOSEC) 20 MG capsule Take 1 capsule (20 mg total) by mouth daily. 08/14/19   Kerin Perna, NP    Allergies    Ciprofloxacin  Review of Systems   Review of Systems Ten systems are reviewed and are negative for acute change except as noted in the HPI  Physical Exam Updated Vital Signs BP 124/70   Pulse 81   Temp 98.2 F (36.8 C) (Oral)   Resp 10   SpO2 98%   Physical Exam Constitutional:      General: He is not in acute distress.    Appearance: Normal appearance. He is well-developed. He is not ill-appearing or diaphoretic.  HENT:     Head: Normocephalic and atraumatic.  Eyes:     General: Vision grossly intact. Gaze aligned appropriately.     Pupils: Pupils are equal, round, and reactive to light.  Neck:     Trachea: Trachea and phonation normal.  Cardiovascular:     Heart sounds: Normal heart sounds.  Pulmonary:     Effort: Pulmonary effort is normal. No respiratory distress.     Breath sounds: Decreased breath sounds present.  Abdominal:     General: There is no distension.     Palpations: Abdomen is soft.     Tenderness: There is no abdominal tenderness. There is no guarding or rebound.  Musculoskeletal:        General: Normal range of motion.     Cervical back: Normal range of motion.  Skin:    General: Skin is warm and dry.  Neurological:     Mental Status: He is alert.     GCS: GCS eye subscore is 4. GCS verbal  subscore is 5. GCS motor subscore is 6.     Comments: Speech is clear and goal oriented, follows commands Major Cranial nerves without deficit, no facial droop Moves extremities without ataxia, coordination intact  Psychiatric:        Behavior: Behavior normal.    ED Results / Procedures / Treatments   Labs (all labs ordered are listed, but only abnormal results are displayed) Labs Reviewed  BASIC METABOLIC PANEL - Abnormal; Notable for the following components:      Result Value   Potassium 3.0 (*)    Glucose, Bld 119 (*)    All other  components within normal limits  RESP PANEL BY RT-PCR (FLU A&B, COVID) ARPGX2  CBC WITH DIFFERENTIAL/PLATELET  TROPONIN I (HIGH SENSITIVITY)  TROPONIN I (HIGH SENSITIVITY)    EKG None  Radiology DG Chest Portable 1 View  Result Date: 01/11/2021 CLINICAL DATA:  50 year old male with history of shortness of breath and cough. EXAM: PORTABLE CHEST 1 VIEW COMPARISON:  Chest x-ray 02/25/2018. FINDINGS: Lung volumes are normal. No consolidative airspace disease. No pleural effusions. No pneumothorax. No pulmonary nodule or mass noted. Pulmonary vasculature and the cardiomediastinal silhouette are within normal limits. IMPRESSION: No radiographic evidence of acute cardiopulmonary disease. Electronically Signed   By: Vinnie Langton M.D.   On: 01/11/2021 06:20    Procedures Procedures   Medications Ordered in ED Medications  ipratropium-albuterol (DUONEB) 0.5-2.5 (3) MG/3ML nebulizer solution 3 mL (3 mLs Nebulization Given by Other 01/11/21 0500)  ipratropium-albuterol (DUONEB) 0.5-2.5 (3) MG/3ML nebulizer solution 3 mL (3 mLs Nebulization Given 01/11/21 1329)  potassium chloride SA (KLOR-CON) CR tablet 40 mEq (40 mEq Oral Given 01/11/21 1423)    ED Course  I have reviewed the triage vital signs and the nursing notes.  Pertinent labs & imaging results that were available during my care of the patient were reviewed by me and considered in my medical  decision making (see chart for details).    MDM Rules/Calculators/A&P                           Additional history obtained from: Nursing notes from this visit. Review of electronic medical records. ----------------------------- 50 year old male presented for what he describes as asthma exacerbation after exposure to concrete dust at work yesterday.  He attempted treatment with home albuterol inhaler without significant improvement.  Patient has received 2 duo nebs today, he reports his shortness of breath is greatly improved.  He did report some burning chest pain only with cough.  Work-up so far included a CBC which does not show leukocytosis to suggest infection, no anemia or thrombocytopenia.  2 negative troponins.  Negative COVID/influenza panel.  BMP shows mild hypokalemia of 3.0, this will be repleted with [place].  No AKI or gap.  Chest x-ray did not show acute findings or evidence for pneumonia.  Given patient's presentation advised patient for asthma exacerbation.  Case was discussed with Dr. Maryan Rued, will treat as asthma exacerbation with duo nebs and Solu-Medrol.  Low suspicion for pulmonary embolism at this time given patient's history and lack of symptoms/signs for DVT or PE.  Additionally low suspicion for ACS, dissection or other emergent causes of shortness of breath. - On reassessment patient reports he is feeling improved, he is awaiting 3rd duoneb and solumedrol. Care handoff given to [facility] PA-C at shift change. Plan is reassessment and potential d/c. Final disposition per oncoming team.   Note: Portions of this report may have been transcribed using voice recognition software. Every effort was made to ensure accuracy; however, inadvertent computerized transcription errors may still be present.  Final Clinical Impression(s) / ED Diagnoses Final diagnoses:  None    Rx / DC Orders ED Discharge Orders     None        Gari Crown 01/11/21  1540    Blanchie Dessert, MD 01/17/21 1323

## 2021-01-11 NOTE — ED Notes (Signed)
Pt discharged and ambulated out of the ED without difficulty. 

## 2021-01-11 NOTE — ED Provider Notes (Signed)
Care assumed from [facility] [place] at shift change pending DuoNeb and reassessment for asthma exacerbation.  See his note for full HPI.  In short, patient is a 50 year old male who presents to the ED due to worsening shortness of breath associated with persistent cough.  Yesterday, patient was around bleach and concrete dust which he feels caused an asthma exacerbation.  He also endorses mild burning in the chest.  No fever or chills.  No sick contacts or known COVID exposures.  Plan from previous provider: Give 3rd duoneb and reassess. Suspect cough related to asthma exacerbation.   Physical Exam  BP 124/70   Pulse 81   Temp 98.2 F (36.8 C) (Oral)   Resp 10   SpO2 98%   Physical Exam Vitals and nursing note reviewed.  Constitutional:      General: He is not in acute distress.    Appearance: He is not ill-appearing.  HENT:     Head: Normocephalic.  Eyes:     Pupils: Pupils are equal, round, and reactive to light.  Cardiovascular:     Rate and Rhythm: Normal rate and regular rhythm.     Pulses: Normal pulses.     Heart sounds: Normal heart sounds. No murmur heard.   No friction rub. No gallop.  Pulmonary:     Effort: Pulmonary effort is normal.     Breath sounds: Normal breath sounds.  Abdominal:     General: Abdomen is flat. There is no distension.     Palpations: Abdomen is soft.     Tenderness: There is no abdominal tenderness. There is no guarding or rebound.  Musculoskeletal:        General: Normal range of motion.     Cervical back: Neck supple.  Skin:    General: Skin is warm and dry.  Neurological:     General: No focal deficit present.     Mental Status: He is alert.  Psychiatric:        Mood and Affect: Mood normal.        Behavior: Behavior normal.    ED Course/Procedures     Procedures Results for orders placed or performed during the hospital encounter of 01/11/21 (from the past 24 hour(s))  CBC with Differential     Status: None   Collection  Time: 01/11/21  5:22 AM  Result Value Ref Range   WBC 10.4 4.0 - 10.5 K/uL   RBC 4.72 4.22 - 5.81 MIL/uL   Hemoglobin 14.8 13.0 - 17.0 g/dL   HCT 44.3 39.0 - 52.0 %   MCV 93.9 80.0 - 100.0 fL   MCH 31.4 26.0 - 34.0 pg   MCHC 33.4 30.0 - 36.0 g/dL   RDW 11.6 11.5 - 15.5 %   Platelets 228 150 - 400 K/uL   nRBC 0.0 0.0 - 0.2 %   Neutrophils Relative % 68 %   Neutro Abs 7.1 1.7 - 7.7 K/uL   Lymphocytes Relative 21 %   Lymphs Abs 2.2 0.7 - 4.0 K/uL   Monocytes Relative 10 %   Monocytes Absolute 1.0 0.1 - 1.0 K/uL   Eosinophils Relative 0 %   Eosinophils Absolute 0.0 0.0 - 0.5 K/uL   Basophils Relative 0 %   Basophils Absolute 0.0 0.0 - 0.1 K/uL   Immature Granulocytes 1 %   Abs Immature Granulocytes 0.05 0.00 - 0.07 K/uL  Basic metabolic panel     Status: Abnormal   Collection Time: 01/11/21  5:22 AM  Result Value  Ref Range   Sodium 138 135 - 145 mmol/L   Potassium 3.0 (L) 3.5 - 5.1 mmol/L   Chloride 101 98 - 111 mmol/L   CO2 28 22 - 32 mmol/L   Glucose, Bld 119 (H) 70 - 99 mg/dL   BUN 16 6 - 20 mg/dL   Creatinine, Ser 1.07 0.61 - 1.24 mg/dL   Calcium 9.3 8.9 - 10.3 mg/dL   GFR, Estimated >60 >60 mL/min   Anion gap 9 5 - 15  Troponin I (High Sensitivity)     Status: None   Collection Time: 01/11/21  5:22 AM  Result Value Ref Range   Troponin I (High Sensitivity) 2 <18 ng/L  Troponin I (High Sensitivity)     Status: None   Collection Time: 01/11/21  1:06 PM  Result Value Ref Range   Troponin I (High Sensitivity) 3 <18 ng/L  Resp Panel by RT-PCR (Flu A&B, Covid) Nasopharyngeal Swab     Status: None   Collection Time: 01/11/21  1:29 PM   Specimen: Nasopharyngeal Swab; Nasopharyngeal(NP) swabs in vial transport medium  Result Value Ref Range   SARS Coronavirus 2 by RT PCR NEGATIVE NEGATIVE   Influenza A by PCR NEGATIVE NEGATIVE   Influenza B by PCR NEGATIVE NEGATIVE    MDM  Care assumed from Nuala Alpha, PA-C at shift change pending DuoNeb and reassessment for asthma  exacerbation.  See his note for full MDM.  50 year old male presents to the ED due to shortness of breath and persistent cough after being around concrete dust and bleach yesterday at work.  Patient has a history of asthma.  Upon arrival, patient mildly tachycardic at 101 and tachypneic.  No hypoxia.  Tachycardia likely due to numerous trials of albuterol prior to arrival.  Tachycardia resolved during his ED stay.  Patient in no acute distress.  CBC unremarkable.  No leukocytosis.  COVID/influenza negative.  BMP significant for mild hypokalemia at 3 and hyperglycemia at 119.  Potassium repleted by previous provider.  Delta troponin flat.  Chest x-ray personally reviewed which is negative for signs of pneumonia, pneumothorax, or widened mediastinum.  EKG demonstrates normal sinus rhythm.  No signs of acute ischemia.  No major changes from previous EKG. Low suspicion for ACS.  4:36 PM reassessed patient at bedside.  Patient admits to resolution in shortness of breath however, endorses dizziness.  Lungs clear to auscultation bilaterally.  No wheeze.  Patient speaking in full sentences.  No signs of respiratory distress.  Patient ate for the first time just prior to my evaluation after 12 hours in the ED.  Dizziness likely due to lack of oral intake. IVFs given. Patient also appears anxious at bedside. Ativan given. Discussed with Dr. Francia Greaves who agrees with assessment and plan.   6:49 PM reassessed patient at bedside.  Patient admits to resolution in dizziness after IV fluids.  Patient states he is ready to go home.  Lungs clear to auscultation bilaterally without wheeze.  No difficulties breathing.  No signs of respiratory distress.  Patient discharged with prednisone and a refill on his albuterol inhaler.  Advised patient follow-up with PCP within the next week for further evaluation. Strict ED precautions discussed with patient. Patient states understanding and agrees to plan. Patient discharged home in no  acute distress and stable vitals         Karie Kirks 01/11/21 1851    Valarie Merino, MD 01/11/21 2302

## 2021-01-11 NOTE — ED Provider Notes (Signed)
Emergency Medicine Provider Triage Evaluation Note  Derek Smith , a 50 y.o. male  was evaluated in triage.  Pt complains of shortness of breath.  The patient reports cough, shortness of breath, wheezing, and chest tightness that feels similar to previous asthma exacerbations.  His symptoms began suddenly 1 hour prior to arrival.  No fever, chills, abdominal pain, vomiting, diarrhea.  He attempted to use his home Symbicort and took a dose of prednisone without improvement in his symptoms.  Review of Systems  Positive: Shortness of breath, wheezing, cough, chest tightness Negative: Fever, chills, abdominal pain, vomiting, diarrhea, rash  Physical Exam  BP (!) 141/79 (BP Location: Right Arm)   Pulse (!) 101   Temp 98.2 F (36.8 C)   Resp (!) 22   SpO2 100%  Gen:   Awake, coughing and gasping for air Resp:  Mild tachypnea.  No accessory muscle use. MSK:   Moves extremities without difficulty  Other:  Initially, no wheezing on exam.  Lung sounds are diminished.  After first nebulizer, faint end expiratory wheezes.  No rhonchi or rales.  Medical Decision Making  Medically screening exam initiated at 5:27 AM.  Appropriate orders placed.  Derek Smith was informed that the remainder of the evaluation will be completed by another provider, this initial triage assessment does not replace that evaluation, and the importance of remaining in the ED until their evaluation is complete.  Patient states that this feels like an asthma exacerbation, but initially I do not hear any wheezing.  I do hear faint end expiratory wheezes after 1 DuoNeb.  However, he is tachypneic and appears to be intermittently gasping for air.  However his oxygen levels are good.  He does have a cardiac history.  Will order labs, chest x-ray, and plan for reassessment.  Patient was counseled in triage that Symbicort is a maintenance inhaler and is on a rescue inhaler.   Joanne Gavel, PA-C 01/11/21 [id number]     Merryl Hacker, MD 01/11/21 [phone]

## 2021-01-12 ENCOUNTER — Other Ambulatory Visit: Payer: Self-pay

## 2021-04-29 ENCOUNTER — Ambulatory Visit (INDEPENDENT_AMBULATORY_CARE_PROVIDER_SITE_OTHER): Payer: Self-pay

## 2021-04-29 NOTE — Telephone Encounter (Signed)
3rd attempt, pt called, LVMTCB and speak with a nurse.   Summary: Coughing Advice    Pt is calling by way of [employer] pt states that he has had a cough for 4 months. His throat gets dry causing the cough. No available appts with Sharyn Lull for 2 weeks.  Pt uses vicks at night. And cough drops during the day.

## 2021-04-29 NOTE — Telephone Encounter (Signed)
Derek Smith #[id number]  Patient called, left VM to return the call to the office to discuss symptoms with a nurse.  Summary: Coughing Advice   Pt is calling by way of [employer] pt states that he has had a cough for 4 months. His throat gets dry causing the cough. No available appts with Sharyn Lull for 2 weeks.  Pt uses vicks at night. And cough drops during the day.   Pt is requesting a Engineer, manufacturing systems when the nurse calls back

## 2021-04-29 NOTE — Telephone Encounter (Signed)
Summary: Coughing Advice   Pt is calling by way of [employer] pt states that he has had a cough for 4 months. His throat gets dry causing the cough. No available appts with Sharyn Lull for 2 weeks.  Pt uses vicks at night. And cough drops during the day.   Pt is requesting a Spanish Interpretor when the nurse calls back       2nd attempt to contact patient and to review symptoms. Called patient via interpreter Wells Guiles ID# [phone]. No answer, LVMTCB [phone].

## 2021-05-02 NOTE — Telephone Encounter (Signed)
FYI

## 2021-05-12 ENCOUNTER — Other Ambulatory Visit: Payer: Self-pay

## 2021-05-12 ENCOUNTER — Other Ambulatory Visit (INDEPENDENT_AMBULATORY_CARE_PROVIDER_SITE_OTHER): Payer: Self-pay

## 2021-05-12 ENCOUNTER — Encounter (INDEPENDENT_AMBULATORY_CARE_PROVIDER_SITE_OTHER): Payer: Self-pay | Admitting: Primary Care

## 2021-05-12 ENCOUNTER — Ambulatory Visit (INDEPENDENT_AMBULATORY_CARE_PROVIDER_SITE_OTHER): Payer: 59 | Admitting: Primary Care

## 2021-05-12 VITALS — BP 137/80 | HR 73 | Temp 98.2°F | Ht 67.0 in | Wt 166.6 lb

## 2021-05-12 DIAGNOSIS — J9801 Acute bronchospasm: Secondary | ICD-10-CM

## 2021-05-12 DIAGNOSIS — R059 Cough, unspecified: Secondary | ICD-10-CM

## 2021-05-12 DIAGNOSIS — Z23 Encounter for immunization: Secondary | ICD-10-CM

## 2021-05-12 DIAGNOSIS — Z131 Encounter for screening for diabetes mellitus: Secondary | ICD-10-CM | POA: Diagnosis not present

## 2021-05-12 LAB — POCT GLYCOSYLATED HEMOGLOBIN (HGB A1C): Hemoglobin A1C: 5.1 % (ref 4.0–5.6)

## 2021-05-12 MED ORDER — BENZONATATE 100 MG PO CAPS
100.0000 mg | ORAL_CAPSULE | Freq: Three times a day (TID) | ORAL | 0 refills | Status: DC
  Filled 2021-05-12: qty 21, 7d supply, fill #0

## 2021-05-12 MED ORDER — ALBUTEROL SULFATE HFA 108 (90 BASE) MCG/ACT IN AERS
1.0000 | INHALATION_SPRAY | Freq: Four times a day (QID) | RESPIRATORY_TRACT | 1 refills | Status: DC | PRN
  Filled 2021-05-12: qty 8, 24d supply, fill #0

## 2021-05-12 MED ORDER — ALBUTEROL SULFATE HFA 108 (90 BASE) MCG/ACT IN AERS
1.0000 | INHALATION_SPRAY | Freq: Four times a day (QID) | RESPIRATORY_TRACT | 0 refills | Status: DC | PRN
  Filled 2021-05-12: qty 18, 53d supply, fill #0

## 2021-05-12 MED ORDER — LORATADINE 10 MG PO TABS
10.0000 mg | ORAL_TABLET | Freq: Every day | ORAL | 11 refills | Status: DC
  Filled 2021-05-12: qty 30, 30d supply, fill #0

## 2021-05-12 MED ORDER — ALBUTEROL SULFATE HFA 108 (90 BASE) MCG/ACT IN AERS
2.0000 | INHALATION_SPRAY | Freq: Four times a day (QID) | RESPIRATORY_TRACT | 0 refills | Status: DC | PRN
  Filled 2021-05-12: qty 8.5, 25d supply, fill #0

## 2021-05-12 NOTE — Patient Instructions (Signed)
Tos en los adultos ?Cough, Adult ?La tos ayuda a [employer] garganta y los pulmones. La tos puede ser un signo de una enfermedad u otra afecci?n m?dica. ?Una tos aguda puede durar [place] 2 o 3 semanas, mientras que una tos cr?nica puede durar 8 semanas o m?s tiempo. ?Hay muchas cosas que pueden causar tos. Estas incluyen lo siguiente: ?G?rmenes (virus o bacterias) que atacan las v?as respiratorias. ?Inhalaci?n de cosas que alteran (irritan) los pulmones. ?Alergias. ?Asma. ?Mucosidad que se desliza por la parte posterior de la garganta (goteo posnasal). ?Fumar. ??cido que vuelve desde el est?mago hacia el tubo que transporta los alimentos desde la boca hasta el est?mago (reflujo gastroesof?gico). ?Algunos medicamentos. ?Problemas pulmonares. ?Otras enfermedades, como insuficiencia card?aca o un co?gulo de sangre en el pulm?n (embolia pulmonar). ?Siga estas instrucciones en su casa: ?Medicamentos ?Tome los medicamentos de venta libre y los recetados solamente como se lo haya indicado el m?dico. ?Hable con el m?dico antes de tomar medicamentos que detienen la tos (antitusivos). ?[place] ? ?No fume y trate de no estar cerca de humo. No consuma ning?n producto que contenga nicotina o tabaco, como cigarrillos, cigarrillos electr?nicos y tabaco de Higher education careers adviser. Si necesita ayuda para dejar de fumar, consulte al m?dico. ?Beba suficiente l?quido para mantener el pis (la orina) de color [place] p?lido. ?Evite la cafe?na. ?No beba alcohol si el m?dico se lo proh?be. ?Instrucciones generales ? ?F?jese si hay alg?n cambio en la tos. Informe a su m?dico sobre ellos. ?Siempre c?brase la boca al toser. ?Al?jese de las cosas que lo hagan toser, como perfumes, velas, humo de fogatas o productos de limpieza. ?Si el aire es [employer], use un humidificador o un vaporizador de niebla fr?a en su hogar. ?Si la tos empeora por la noche, pruebe con usar almohadas adicionales para elevar la cabeza mientras duerme. ?Descanse todo lo que sea  necesario. ?Concurra a todas las visitas de seguimiento como se lo haya indicado el m?dico. Esto es importante. ?Comun?quese con un m?dico si: ?Aparecen nuevos s?ntomas. ?Tose y escupe pus. ?La tos no mejora despu?s de 2 o 3 semanas o empeora. ?Los medicamentos para la tos no [employer] tos y usted no duerme bien. ?Siente un dolor que empeora o un dolor que no se alivia con medicamentos. ?Tiene fiebre. ?Est? adelgazando y no sabe por qu?Marland Kitchen ?Transpira durante la noche. ?Solicite ayuda inmediatamente si: ?Tose y escupe sangre. ?Tiene dificultad para respirar. ?El coraz?n le late [place] r?pido. ?Estos s?ntomas pueden Sales executive. No espere a ver si los s?ntomas desaparecen. Solicite atenci?n m?dica de inmediato. Comun?quese con el servicio de emergencias de su localidad (911 en los [place]). No conduzca por sus propios medios [employer]. ?Resumen ?La tos ayuda a [employer] garganta y los pulmones. Hay muchas cosas que pueden causar tos. ?[employer] de venta libre y los recetados solamente como se lo haya indicado el m?dico. ?Siempre c?brase la boca al toser. ?Comun?quese con un m?dico si tiene s?ntomas nuevos o tiene una tos que no mejora o que [place]. ?Esta informaci?n no tiene Marine scientist el consejo del m?dico. Aseg?rese de hacerle al m?dico cualquier pregunta que tenga. ?Document Revised: 04/24/2018 Document Reviewed: 04/24/2018 ?Elsevier Patient Education ? [place]. ? ?

## 2021-05-12 NOTE — Progress Notes (Signed)
? ? ? [place] ? ? ? ? ?Subjective:  ?  ? Derek Smith is a 51 y.o. male who Hispanic maleImagene Gurney  [phone]) presents for evaluation of eye irritation, headache  , and nonproductive cough. Symptoms began 4 months ago. Symptoms have been waxing and weaning since that time. Past history is significant for nothing. Patient has  No chest pain, No abdominal pain - No Nausea, No new weakness tingling or numbness, No Cough -shortness of breath.  He admits to 24 oz of coffee a day and very little soda.  ? ? ?The following portions of the patient's history were reviewed and updated as appropriate: allergies, current medications, past family history, past medical history, past social history, and past surgical history. ? ?Review of Systems ?Pertinent items noted in HPI and remainder of comprehensive ROS otherwise negative.  ?  ?Objective:  ? ? BP 137/80 (BP Location: Right Arm, Patient Position: Sitting, Cuff Size: Normal)   Pulse 73   Temp 98.2 ?F (36.8 ?C) (Oral)   Ht 5\' 7"  (1.702 m)   Wt 166 lb 9.6 oz (75.6 kg)   SpO2 97%   BMI 26.09 kg/m?  ? ? Physical exam: ?General: Vital signs reviewed.  Patient is well-developed and well-nourished, normal weight male in no acute distress and cooperative with exam. ?Head: Normocephalic and atraumatic. ?Eyes: EOMI, conjunctivae normal, no scleral icterus. ?Neck: Supple, trachea midline, normal ROM, no JVD, masses, thyromegaly, or carotid bruit present. ?Cardiovascular: RRR, S1 normal, S2 normal, no murmurs, gallops, or rubs. ?Pulmonary/Chest: Clear to auscultation bilaterally, no wheezes, rales, or rhonchi. ?Abdominal: Soft, non-tender, non-distended, BS +, no masses, organomegaly, or guarding present. ?Musculoskeletal: No joint deformities, erythema, or stiffness, ROM full and nontender. ?Extremities: No lower extremity edema bilaterally,  pulses symmetric and intact bilaterally. No cyanosis or clubbing. ?Neurological: A&O x3, Strength is  normal ?Skin: Warm, dry and intact. No rashes or erythema. ?Psychiatric: Normal mood and affect. speech and behavior is normal. Cognition and memory are normal. ?   ?  ?Assessment:  ? ? Derek Smith was seen today for cough. ? ?Diagnoses and all orders for this visit: ? ?Need for Tdap vaccination ?-     Tdap vaccine greater than or equal to 7yo IM ? ?Screening for diabetes mellitus ?-     HgB A1c 5.1 ? ?Cough, unspecified type ?DDX is cough variant asthma, upper airway cough syndrome (previously termed postnasal drip syndrome) or GERD, although many studies of chronic cough show pts have more than one mechanism  ? ?Bronchospasm ? Worsening signs and symptoms discussed. ?Rest, fluids, acetaminophen, and humidification. ?Follow up as needed for persistent, worsening cough, or appearance of new symptoms ?-     loratadine (CLARITIN) 10 MG tablet; Take 1 tablet (10 mg total) by mouth daily. ? ?Need for immunization against influenza ?-     Flu Vaccine QUAD 61mo+IM (Fluarix, Fluzone & Alfiuria Quad PF) ? ?Other orders ?-     benzonatate (TESSALON) 100 MG capsule; Take 1 capsule (100 mg total) by mouth every 8 (eight) hours. ?-     Discontinue: albuterol (VENTOLIN HFA) 108 (90 Base) MCG/ACT inhaler; Inhale 1-2 puffs into the lungs every 6 (six) hours as needed for wheezing or shortness of breath. ?-     Discontinue: albuterol (VENTOLIN HFA) 108 (90 Base) MCG/ACT inhaler; Inhale 1-2 puffs into the lungs every 6 (six) hours as needed for wheezing or shortness of breath. ? ?.  ?This note has been created with Museum/gallery curator  and smart Company secretary. Any transcriptional errors are unintentional.  ? ?Kerin Perna, NP ?05/12/2021, 11:28 AM  ?

## 2021-09-05 ENCOUNTER — Other Ambulatory Visit: Payer: Self-pay

## 2021-09-05 ENCOUNTER — Ambulatory Visit (INDEPENDENT_AMBULATORY_CARE_PROVIDER_SITE_OTHER): Payer: BC Managed Care – PPO | Admitting: Primary Care

## 2021-09-05 ENCOUNTER — Encounter (INDEPENDENT_AMBULATORY_CARE_PROVIDER_SITE_OTHER): Payer: Self-pay | Admitting: Primary Care

## 2021-09-05 VITALS — BP 129/79 | HR 70 | Temp 98.4°F | Ht 67.0 in | Wt 172.6 lb

## 2021-09-05 DIAGNOSIS — R059 Cough, unspecified: Secondary | ICD-10-CM

## 2021-09-05 DIAGNOSIS — G4733 Obstructive sleep apnea (adult) (pediatric): Secondary | ICD-10-CM

## 2021-09-05 DIAGNOSIS — Z1211 Encounter for screening for malignant neoplasm of colon: Secondary | ICD-10-CM

## 2021-09-05 DIAGNOSIS — F4323 Adjustment disorder with mixed anxiety and depressed mood: Secondary | ICD-10-CM | POA: Diagnosis not present

## 2021-09-05 MED ORDER — CETIRIZINE HCL 10 MG PO TABS
10.0000 mg | ORAL_TABLET | Freq: Every day | ORAL | 11 refills | Status: DC
Start: 2021-09-05 — End: 2022-01-09
  Filled 2021-09-05 (×2): qty 30, 30d supply, fill #0

## 2021-09-05 MED ORDER — BUSPIRONE HCL 5 MG PO TABS
5.0000 mg | ORAL_TABLET | Freq: Two times a day (BID) | ORAL | 1 refills | Status: DC
  Filled 2021-09-05: qty 60, 30d supply, fill #0

## 2021-09-06 ENCOUNTER — Other Ambulatory Visit: Payer: Self-pay

## 2021-09-30 ENCOUNTER — Other Ambulatory Visit (INDEPENDENT_AMBULATORY_CARE_PROVIDER_SITE_OTHER): Payer: Self-pay | Admitting: Primary Care

## 2021-09-30 ENCOUNTER — Other Ambulatory Visit: Payer: Self-pay

## 2021-09-30 MED ORDER — ALBUTEROL SULFATE HFA 108 (90 BASE) MCG/ACT IN AERS
2.0000 | INHALATION_SPRAY | Freq: Four times a day (QID) | RESPIRATORY_TRACT | 0 refills | Status: DC | PRN
  Filled 2021-09-30 – 2022-03-17 (×2): qty 8.5, 25d supply, fill #0

## 2021-10-07 ENCOUNTER — Other Ambulatory Visit: Payer: Self-pay

## 2021-11-07 ENCOUNTER — Telehealth (INDEPENDENT_AMBULATORY_CARE_PROVIDER_SITE_OTHER): Payer: Self-pay | Admitting: Primary Care

## 2021-11-07 ENCOUNTER — Encounter (INDEPENDENT_AMBULATORY_CARE_PROVIDER_SITE_OTHER): Payer: Self-pay | Admitting: Primary Care

## 2021-11-07 ENCOUNTER — Ambulatory Visit (INDEPENDENT_AMBULATORY_CARE_PROVIDER_SITE_OTHER): Payer: BC Managed Care – PPO | Admitting: Primary Care

## 2021-11-07 VITALS — BP 126/83 | HR 78 | Temp 98.0°F | Ht 67.0 in | Wt 172.6 lb

## 2021-11-07 DIAGNOSIS — Z23 Encounter for immunization: Secondary | ICD-10-CM

## 2021-11-07 DIAGNOSIS — L739 Follicular disorder, unspecified: Secondary | ICD-10-CM

## 2021-11-07 DIAGNOSIS — F4323 Adjustment disorder with mixed anxiety and depressed mood: Secondary | ICD-10-CM

## 2021-11-07 NOTE — Patient Instructions (Addendum)
Gripe en los adultos Influenza, Adult A la gripe tambin se la conoce como "influenza". Es una [employer], la nariz y la garganta (vas respiratorias). Se transmite fcilmente de persona a persona (es contagiosa). La gripe causa sntomas que son [employer] de un resfro, junto con fiebre alta y dolores corporales. Cules son las causas? La causa de esta afeccin es el virus de la influenza. Puede contraer el virus de las siguientes maneras: Al inhalar gotitas que quedan en el aire despus de que una persona infectada con gripe tosi o estornud. Al tocar algo que est contaminado con el virus y [employer] mano a la boca, la nariz o los ojos. Qu incrementa el riesgo? Hay ciertas cosas que lo pueden hacer ms propenso a Nurse, adult. Estas incluyen lo siguiente: No lavarse las manos con frecuencia. Tener contacto cercano con [employer] durante la temporada de resfro y gripe. Tocarse la boca, los ojos o la nariz sin antes lavarse las manos. No recibir la [employer]. Puede correr un mayor riesgo de [place] gripe, y [place] graves, como una infeccin pulmonar (neumona), si usted: Es mayor de 39 aos de edad. Est embarazada. Tiene debilitado el sistema que combate las defensas (sistema inmunitario) debido a una enfermedad o a que toma determinados medicamentos. Tiene una afeccin a largo plazo (crnica), como las siguientes: Enfermedad cardaca, renal o pulmonar. Diabetes. Asma. Tiene un trastorno heptico. Tiene mucho sobrepeso (obesidad [place]). Tiene anemia. Cules son los signos o sntomas? Los sntomas normalmente comienzan de repente y [place] [address] y [address] Pueden incluir los siguientes: Cristy Hilts y [place]. Dolores de [place], dolores en el cuerpo o dolores musculares. Dolor de Investment banker, operational. Tos. Secrecin o congestin nasal. Molestias en el pecho. No querer comer tanto como lo hace normalmente. Sensacin de debilidad o  cansancio. Mareos. Malestar estomacal o vmitos. Cmo se trata? Si la gripe se detecta de forma temprana, puede recibir tratamiento con medicamentos antivirales. Esto puede ayudar a reducir la gravedad y la duracin de la enfermedad. Se los administrarn por boca o a travs de un tubo (catter) intravenoso. Cuidarse en su hogar puede ayudar a que mejoren los sntomas. El mdico puede recomendarle lo siguiente: Tomar medicamentos de Radio broadcast assistant. Beber abundante lquido. La gripe suele desaparecer sola. Si tiene sntomas muy graves u otros problemas, puede recibir tratamiento en un hospital. Siga estas instrucciones en su casa:     Actividad Descanse todo lo que sea necesario. Duerma lo suficiente. Foy [place] en su casa y no concurra al [place] o a la escuela, como se lo haya indicado el mdico. No salga de su casa hasta que no haya tenido fiebre por 24 horas sin tomar medicamentos. Salga de su casa solamente para ir al [employer]. Comida y bebida Wyatt Haste SRO (solucin de rehidratacin oral). Es Ardelia Mems bebida que se vende en farmacias y tiendas. Beba suficiente lquido como para Theatre manager la orina de color amarillo plido. En la medida en que pueda, beba lquidos transparentes en pequeas cantidades. Los lquidos transparentes son, por ejemplo: Grayce Sessions. Trocitos de hielo. Jugo de frutas mezclado con agua. Bebidas deportivas de bajas caloras. Coma alimentos suaves que sean fciles de digerir. En la medida que pueda, consuma pequeas cantidades. Estos alimentos incluyen: Bananas. Pur de [employer]. Arroz. Carnes magras. Tostadas. Galletas. No coma ni beba lo siguiente: Lquidos con alto contenido de azcar o cafena. Alcohol. Alimentos condimentados o con alto contenido de [place]. Indicaciones generales Use los medicamentos de venta libre y los  recetados solamente como se lo haya indicado el mdico. Use un humidificador de aire fro para que el aire de su casa est ms hmedo. Esto puede  facilitar la respiracin. Cuando utilice un humidificador de vapor fro, lmpielo a diario. Vace el agua y [place] por agua limpia. Al toser o estornudar, cbrase la boca y la [place]. Lvese las manos frecuentemente con agua y [place] y durante al menos 20 segundos. Esto tambin es importante despus de toser o Brewing technologist. Si no dispone de [place] y [place], use desinfectante para manos con alcohol. Cumpla con todas las visitas de seguimiento. Cmo se previene?  Colquese la vacuna antigripal todos los [place]. Puede colocarse la vacuna contra la gripe a fines de verano, en otoo o en invierno. Pregntele al mdico cundo debe aplicarse la vacuna contra la gripe. Evite el contacto con personas que estn enfermas durante el otoo y el invierno. Es la temporada del resfro y Counsellor. Comunquese con un mdico si: Tiene sntomas nuevos. Tiene los siguientes sntomas: Dolor de [place]. Materia fecal lquida (diarrea). Cristy Hilts. La tos empeora. Empieza a tener ms mucosidad. Tiene Higher education careers adviser. Vomita. Solicite ayuda de inmediato si: Le falta el aire. Tiene dificultad para respirar. La piel o las uas se ponen de un color azulado. Presenta dolor muy intenso o rigidez en el cuello. Tiene dolor de cabeza repentino. Le duele la cara o el odo de forma repentina. No puede comer ni beber sin vomitar. Estos sntomas pueden representar un problema grave que constituye Engineer, maintenance (IT). Solicite atencin mdica de inmediato. Comunquese con el servicio de emergencias de su localidad (911 en los [place]). No espere a ver si los sntomas desaparecen. No conduzca por sus propios medios [employer]. Resumen A la gripe tambin se la conoce como "influenza". Es una [employer], la nariz y Patent examiner. Se transmite fcilmente de [place] persona a otra. Use los medicamentos de venta libre y los recetados solamente como se lo haya indicado el mdico. Aplicarse la vacuna contra la gripe  todos los aos es la mejor manera de no contagiarse la gripe. Esta informacin no tiene Marine scientist el consejo del mdico. Asegrese de hacerle al mdico cualquier pregunta que tenga. Document Revised: 12/25/2019 Document Reviewed: 12/25/2019 Elsevier Patient Education  [place] Foliculitis Folliculitis  La foliculitis es una inflamacin de los folculos pilosos. La foliculitis ocurre con mayor frecuencia en el cuero cabelludo, los muslos, las piernas, la espalda y las nalgas. Sin embargo, puede ocurrir en cualquier parte del cuerpo. Cules son las causas? Esta afeccin puede ser causada por lo siguiente: Una infeccin bacteriana (frecuente). Infecciones por hongos. Una infeccin viral. Contacto con ciertas sustancias qumicas, especialmente aceites y alquitrn. Rasurarse o depilarse con cera. Ungentos grasos o cremas [employer]. La foliculitis de larga duracin y la foliculitis que se hace recurrente pueden ser causadas por bacterias. Estas bacterias pueden vivir en cualquier parte de la piel y, a menudo, se encuentran en las fosas nasales. Qu incrementa el riesgo? Es ms probable que tenga esta afeccin si presenta: Un sistema inmunitario debilitado. Diabetes. Obesidad. Cules son los signos o los sntomas? Los sntomas de esta afeccin incluyen: Enrojecimiento. Molestias. Hinchazn. Picazn. Pequeas manchas blancas o amarillas, llenas de pus, que causan picazn (pstulas) que aparecen sobre una zona enrojecida. Si hay una infeccin que se adentra en el folculo, puede transformarse en un fornculo (divieso). Un grupo de fornculos juntos (ntrax). En general, estos se forman en zonas pilosas y sudorosas  del cuerpo. Cmo se diagnostica? Esta afeccin se diagnostica con un examen cutneo. Para encontrar la causa de la afeccin, el mdico puede tomar una muestra de una de las pstulas o de los fornculos para Personnel officer en un laboratorio. Cmo  se trata? El tratamiento para esta afeccin puede incluir lo siguiente: La aplicacin de compresas calientes en la zona afectada. Tomar un antibitico o aplicar un antibitico en la piel. Aplicarse una solucin antisptica o baarse con una solucin antisptica. Tomar un medicamento de venta libre para Barrister's clerk. Someterse a un procedimiento para drenar las pstulas o los fornculos. Este procedimiento puede ser necesario si una pstula o un fornculo contiene mucho pus o lquido. Depilacin lser. Esta se puede realizar para tratar la foliculitis de larga duracin. Siga estas instrucciones en su casa: Control del dolor y de la hinchazn  Si se lo indican, aplique calor en la zona afectada con la frecuencia que le haya indicado el mdico. Use la fuente de calor que el mdico le recomiende, como una compresa de calor hmedo o una almohadilla trmica. Coloque una toalla entre la piel y la fuente de Freight forwarder. Aplique calor durante 20 a 30 minutos. Retire la fuente de calor si la piel se pone de color rojo brillante. Esto es especialmente importante si no puede sentir dolor, calor o fro. Puede correr un riesgo mayor de sufrir quemaduras. Instrucciones generales Si le recetaron un antibitico, tmelo o aplqueselo como se lo haya indicado el mdico. No deje de usar el antibitico aunque la afeccin mejore. [place] zona irritada para detectar signos de infeccin. Est atento a los siguientes signos: Enrojecimiento, [place]. Lquido o sangre. Calor. Pus o mal olor. No rasure la piel irritada. Tome los medicamentos de venta libre y los recetados solamente como se lo haya indicado el mdico. Consulting civil engineer a todas las visitas de seguimiento como se lo haya indicado el mdico. Esto es importante. Solicite ayuda de inmediato si: Tiene ms enrojecimiento, hinchazn o dolor en la zona afectada. Lneas rojas que se extienden desde la zona afectada. Tiene  fiebre. Resumen La foliculitis es una inflamacin de los folculos pilosos. La foliculitis ocurre con mayor frecuencia en el cuero cabelludo, los muslos, las piernas, la espalda y las nalgas. Esta afeccin puede tratarse tomando antibiticos o aplicando un antibitico en la piel, adems de aplicarse una solucin antisptica o bandose con una solucin antisptica. Si le recetaron un antibitico, tmelo o aplqueselo como se lo haya indicado el mdico. No deje de usar el antibitico aunque la afeccin mejore. Obtenga ayuda de inmediato si presenta nuevos sntomas o si sus sntomas empeoran. Concurra a todas las visitas de seguimiento como se lo haya indicado el mdico. Esto es importante. Esta informacin no tiene Marine scientist el consejo del mdico. Asegrese de hacerle al mdico cualquier pregunta que tenga. Document Revised: 01/19/2021 Document Reviewed: 01/19/2021 Elsevier Patient Education  [place].

## 2021-11-07 NOTE — Progress Notes (Signed)
[place], is a 51 y.o. male  GDJ:[id number]  QQI:[id number]  DOB - 1970-04-15  Chief Complaint  Patient presents with   Follow-up    Anxiety/depression Does not feel that the medication prescribed is effective        Subjective:   [name] is a 51 y.o. Hispanic male (interpreter [place] [phone]) here today for a follow up visit for anxiety and depression prescribed Buspar felt  did no help. He found a natural herb remedy and feels better now . No thoughts of harm to self or others, no auditory or visual hallucination. ( Home made remedy honey, lemon, ginger anis ) He does voice concern about about a bump on the back of his head . Patient has No headache, No chest pain, No abdominal pain - No Nausea, No new weakness tingling or numbness, No Cough - shortness of breath  No problems updated.  Allergies  Allergen Reactions   Ciprofloxacin     Possible rash    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    Syncope 03/28/2016    Current Outpatient Medications on File Prior to Visit  Medication Sig Dispense Refill   busPIRone (BUSPAR) 5 MG tablet Take 1 tablet (5 mg total) by mouth 2 (two) times daily. 60 tablet 1   albuterol (VENTOLIN HFA) 108 (90 Base) MCG/ACT inhaler Inhale 2 puffs into the lungs every 6 (six) hours as needed for wheezing or shortness of breath. (Patient not taking: Reported on 11/07/2021) 8.5 g 0   cetirizine (ZYRTEC) 10 MG tablet Take 1 tablet (10 mg total) by mouth daily. (Patient not taking: Reported on 11/07/2021) 30 tablet 11   EPINEPHrine (EPIPEN 2-PAK) 0.3 mg/0.3 mL IJ SOAJ injection Inject 0.3 mg into the muscle as needed for anaphylaxis. (Patient not taking: Reported on 09/05/2021) 2 each 1   No current facility-administered medications on file prior to visit.    Objective:   Vitals:   11/07/21 1543  BP: 126/83  Pulse: 78  Temp:  98 F (36.7 C)  TempSrc: Oral  SpO2: 97%  Weight: 172 lb 9.6 oz (78.3 kg)  Height: '5\' 7"'$  (1.702 m)    Exam General appearance : Awake, alert, not in any distress. Speech Clear. Not toxic looking HEENT: Atraumatic and Normocephalic, pupils equally reactive to light and accomodation Neck: Supple, no JVD. No cervical lymphadenopathy.  Chest: Good air entry bilaterally, no added sounds  CVS: S1 S2 regular, no murmurs.  Abdomen: Bowel sounds present, Non tender and not distended with no gaurding, rigidity or rebound. Extremities: B/L Lower Ext shows no edema, both legs are warm to touch  Neurology: Awake alert, and oriented X 3, CN II-XII intact, Non focal Skin: No Rash  Data Review Lab Results  Component Value Date   HGBA1C 5.1 05/12/2021    Assessment & Plan  Maddon was seen today for follow-up.  Diagnoses and all orders for this visit:  Adjustment disorder with mixed anxiety and depressed mood Resolved with home remedies   Folliculitis   Care and information provided on AVS    Patient have been counseled extensively about nutrition and exercise. Other issues discussed during this visit include: low cholesterol diet, weight control and daily exercise, foot care, annual eye examinations at Ophthalmology, importance of adherence with medications and regular follow-up. We also discussed long term complications of uncontrolled diabetes and hypertension.  Return if symptoms worsen or fail to improve.  The patient was given clear instructions to go to ER or return to medical center if symptoms don't improve, worsen or new problems develop. The patient verbalized understanding. The patient was told to call to get lab results if they haven't heard anything in the next week.   This note has been created with Surveyor, quantity. Any transcriptional errors are unintentional.   Kerin Perna, NP 11/07/2021, 4:09 PM

## 2021-11-28 ENCOUNTER — Encounter: Payer: Self-pay | Admitting: Primary Care

## 2022-01-09 ENCOUNTER — Ambulatory Visit (INDEPENDENT_AMBULATORY_CARE_PROVIDER_SITE_OTHER): Payer: BC Managed Care – PPO | Admitting: Physician Assistant

## 2022-01-09 ENCOUNTER — Encounter: Payer: Self-pay | Admitting: Physician Assistant

## 2022-01-09 ENCOUNTER — Other Ambulatory Visit: Payer: Self-pay

## 2022-01-09 VITALS — BP 102/70 | HR 80 | Ht 67.0 in | Wt 169.0 lb

## 2022-01-09 DIAGNOSIS — Z1212 Encounter for screening for malignant neoplasm of rectum: Secondary | ICD-10-CM

## 2022-01-09 DIAGNOSIS — R053 Chronic cough: Secondary | ICD-10-CM | POA: Diagnosis not present

## 2022-01-09 DIAGNOSIS — Z1211 Encounter for screening for malignant neoplasm of colon: Secondary | ICD-10-CM

## 2022-01-09 MED ORDER — PANTOPRAZOLE SODIUM 40 MG PO TBEC
40.0000 mg | DELAYED_RELEASE_TABLET | Freq: Two times a day (BID) | ORAL | 3 refills | Status: DC
  Filled 2022-01-09: qty 60, 30d supply, fill #0

## 2022-01-09 MED ORDER — PLENVU 140 G PO SOLR
1.0000 | ORAL | 0 refills | Status: DC
  Filled 2022-01-09: qty 3, 1d supply, fill #0

## 2022-01-09 NOTE — Progress Notes (Signed)
Agree with assessment and plan as outlined. He had a pretty good work-up for cough in the past, EGD and pH study did not support diagnosis of reflux.  He can see his PCP for this and they can determine if they want to refer him to pulmonary or ENT if symptoms persist.  I would not keep him on chronic PPI if this does not help with symptoms.

## 2022-01-09 NOTE — Progress Notes (Signed)
Chief Complaint: "Issues with throat" and discuss colonoscopy  HPI:    [name] is a 51 year old Hispanic male, assigned to Dr. Havery Moros, with a past medical history as listed below including anxiety, who was referred to me by Kerin Perna, NP for a complaint of "issues with throat" and to discuss a colonoscopy.      02/28/2018 patient seen in clinic and described that for 4 to 5 months he had a cough aggravated by eating.  At that time his Omeprazole was increased to 40 twice daily and he was scheduled for an EGD.    03/20/2018 EGD was normal.  He was scheduled for pH impedance study.    05/08/2018 pH impedance study showed no symptom correlation with reflux.  Good acid suppression on PPI.    Today, patient presents to clinic accompanied by interpreter , with the same complaint he had back in 2019 of a chronic cough which seems aggravated by eating.  This was previously worked up as above at that time.  Patient had reflux which was well controlled on 40 mg twice daily of a PPI at the time.  He is no longer taking that and actually does not recall taking it at all.  Other than this annoying dry cough which has seemed to resurface over the past year he has no other symptoms.    Aware that he needs a colonoscopy for screening.    Denies fever, chills, weight loss, blood in his stool, change in bowel habits, abdominal pain, heartburn or reflux.  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    Syncope 03/28/2016    Past Surgical History:  Procedure Laterality Date   39 HOUR [place] STUDY N/A 05/08/2018   Procedure: 24 HOUR PH STUDY;  Surgeon: Mauri Pole, MD;  [facility]: [facility];  Service: Endoscopy;  Laterality: N/A;   ESOPHAGEAL MANOMETRY N/A 05/08/2018   Procedure: ESOPHAGEAL MANOMETRY (EM);  Surgeon: Mauri Pole, MD;  Location: [facility];  Service: Endoscopy;  Laterality: N/A;   LEFT HEART  CATH AND CORONARY ANGIOGRAPHY N/A 05/22/2016   Procedure: Left Heart Cath and Coronary Angiography;  Surgeon: Jettie Booze, MD;  Location: [facility];  Service: Cardiovascular;  Laterality: N/A;    Current Outpatient Medications  Medication Sig Dispense Refill   albuterol (VENTOLIN HFA) 108 (90 Base) MCG/ACT inhaler Inhale 2 puffs into the lungs every 6 (six) hours as needed for wheezing or shortness of breath. (Patient not taking: Reported on 11/07/2021) 8.5 g 0   busPIRone (BUSPAR) 5 MG tablet Take 1 tablet (5 mg total) by mouth 2 (two) times daily. 60 tablet 1   cetirizine (ZYRTEC) 10 MG tablet Take 1 tablet (10 mg total) by mouth daily. (Patient not taking: Reported on 11/07/2021) 30 tablet 11   EPINEPHrine (EPIPEN 2-PAK) 0.3 mg/0.3 mL IJ SOAJ injection Inject 0.3 mg into the muscle as needed for anaphylaxis. (Patient not taking: Reported on 09/05/2021) 2 each 1   No current facility-administered medications for this visit.    Allergies as of 01/09/2022 - Review Complete 11/07/2021  Allergen Reaction Noted   Ciprofloxacin  09/27/2020    Family History  Problem Relation Age of Onset   GI problems Mother    Ovarian cancer Mother    Allergic rhinitis Neg Hx    Eczema Neg Hx    Asthma Neg Hx    Urticaria  Neg Hx    Pancreatic cancer Neg Hx    Stomach cancer Neg Hx    Colon cancer Neg Hx    Esophageal cancer Neg Hx     Social History   Socioeconomic History   Marital status: Married    Spouse name: Not on file   Number of children: Not on file   Years of education: Not on file   Highest education level: Not on file  Occupational History   Not on file  Tobacco Use   Smoking status: Never    Passive exposure: Current   Smokeless tobacco: Never  Vaping Use   Vaping Use: Never used  Substance and Sexual Activity   Alcohol use: No   Drug use: No   Sexual activity: Yes  Other Topics Concern   Not on file  Social History Narrative   Not on file   Social  Determinants of Health   Financial Resource Strain: Not on file  Food Insecurity: Not on file  Transportation Needs: Not on file  Physical Activity: Not on file  Stress: Not on file  Social Connections: Not on file  Intimate Partner Violence: Not on file    Review of Systems:    Constitutional: No weight loss, fever or chills Skin: No rash  Cardiovascular: No chest pain   Respiratory: No SOB  Gastrointestinal: See HPI and otherwise negative Genitourinary: No dysuria Neurological: No headache, dizziness or syncope Musculoskeletal: No new muscle or joint pain Hematologic: No bleeding Psychiatric: No history of depression or anxiety   Physical Exam:  Vital signs: BP 102/70   Pulse 80   Ht '5\' 7"'$  (1.702 m)   Wt 169 lb (76.7 kg)   BMI 26.47 kg/m    Constitutional:   Pleasant Hispanic male appears to be in NAD, Well developed, Well nourished, alert and cooperative Head:  Normocephalic and atraumatic. Eyes:   PEERL, EOMI. No icterus. Conjunctiva pink. Ears:  Normal auditory acuity. Neck:  Supple Throat: Oral cavity and pharynx without inflammation, swelling or lesion.  Respiratory: Respirations even and unlabored. Lungs clear to auscultation bilaterally.   No wheezes, crackles, or rhonchi.  Cardiovascular: Normal S1, S2. No MRG. Regular rate and rhythm. No peripheral edema, cyanosis or pallor.  Gastrointestinal:  Soft, nondistended, nontender. No rebound or guarding. Normal bowel sounds. No appreciable masses or hepatomegaly. Rectal:  Not performed.  Msk:  Symmetrical without gross deformities. Without edema, no deformity or joint abnormality.  Neurologic:  Alert and  oriented x4;  grossly normal neurologically.  Skin:   Dry and intact without significant lesions or rashes. Psychiatric: Demonstrates good judgement and reason without abnormal affect or behaviors.  No recent labs or imaging.  Assessment: 1.  Chronic cough: Similar complaint back in 2019 worked up with a normal  EGD and normal pH study while on a twice daily PPI, apparently this resolved itself years ago but is come back over the past year, not currently on a PPI; consider relation to reflux versus other 2.  Screening for colorectal cancer: Patient is 3 and has not had screening for colorectal cancer, due for colonoscopy  Plan: 1.  Restarted Pantoprazole 40 mg twice daily, 30-60 minutes before breakfast and dinner.  #60 with 5 refills. 2.  Scheduled patient for a screening colonoscopy in the [facility] with Dr. Havery Moros.  Did provide the patient a detailed list of risks for the procedure and he agrees to proceed. Patient is appropriate for endoscopic procedure(s) in the ambulatory ([facility]) setting.  3.  Pending symptom resolution or not could consider referral to ENT as a first step for chronic cough 4.  Patient to follow in clinic per recommendations from Dr. Havery Moros after time of procedure.  Ellouise Newer, PA-C [facility] 01/09/2022, 11:34 AM  Cc: Kerin Perna, NP

## 2022-01-09 NOTE — Patient Instructions (Signed)
You have been scheduled for a colonoscopy. Please follow written instructions given to you at your visit today.  Please pick up your prep supplies at the pharmacy within the next 1-3 days. If you use inhalers (even only as needed), please bring them with you on the day of your procedure.  We have sent the following medications to your pharmacy for you to pick up at your convenience: Pantoprazole 40 mg twice daily  _______________________________________________________  If you are age 40 or older, your body mass index should be between 23-30. Your Body mass index is 26.47 kg/m. If this is out of the aforementioned range listed, please consider follow up with your Primary Care Provider.  If you are age 50 or younger, your body mass index should be between 19-25. Your Body mass index is 26.47 kg/m. If this is out of the aformentioned range listed, please consider follow up with your Primary Care Provider.   ________________________________________________________  The [facility] providers would like to encourage you to use [facility] to communicate with providers for non-urgent requests or questions.  Due to long hold times on the telephone, sending your provider a message by [facility] may be a faster and more efficient way to get a response.  Please allow 48 business hours for a response.  Please remember that this is for non-urgent requests.  _______________________________________________________ Due to recent changes in healthcare laws, you may see the results of your imaging and laboratory studies on MyChart before your provider has had a chance to review them.  We understand that in some cases there may be results that are confusing or concerning to you. Not all laboratory results come back in the same time frame and the provider may be waiting for multiple results in order to interpret others.  Please give [place] 48 hours in order for your provider to thoroughly review all the results before  contacting the office for clarification of your results.  ________________________________________________________  Derek Smith Stanford programado una colonoscopia. Siga las instrucciones escritas que se le dieron en su visita de hoy. Recoja sus suministros de preparacin en la farmacia dentro de los prximos 1 a 3 das. Si [place] inhaladores (incluso solo cuando sea necesario), trigalos con usted el da de su procedimiento.  Hemos enviado los siguientes medicamentos a su farmacia para que los recoja cuando le convenga: Pantoprazol 40 mg [employer] al da  _______________________________________________________  Si tiene 65 aos o ms, su ndice de masa corporal debe estar entre 23 y 35. Su ndice de masa corporal es de 26,47 kg/m. Si esto est fuera del rango mencionado anteriormente, considere realizar un seguimiento con su proveedor de Midwife.  Si tiene 43 aos o menos, su ndice de [employer] corporal debe estar entre 23 y 17. Su ndice de masa corporal es de 26,47 kg/m. Si esto est fuera del rango mencionado anteriormente, considere realizar un seguimiento con su proveedor de Midwife.  ________________________________________________________  [employer] de [facility] desean alentarlo a Risk manager MYCHART para comunicarse con los proveedores para solicitudes o preguntas que no sean urgentes. Debido a los Astronomer de espera en el telfono, enviar un mensaje a su proveedor a travs de [employer] puede ser una forma ms rpida y eficiente de obtener una respuesta. Espere 48 horas hbiles para recibir [employer]. Recuerde que esto es para solicitudes no urgentes. _______________________________________________________ Debido a cambios recientes en las leyes de atencin mdica, es posible que vea los [place] de sus estudios de imgenes y de laboratorio en  MyChart antes de que su proveedor haya tenido la oportunidad de revisarlos. Entendemos que en algunos casos puede haber resultados  que sean confusos o preocupantes para usted. No todos los resultados de laboratorio llegan en el mismo perodo de tiempo y es posible que el proveedor est esperando varios resultados para Primary school teacher. Dnos 56 horas para que su proveedor revise minuciosamente todos los resultados antes de comunicarse con la oficina para Audiological scientist.

## 2022-01-25 ENCOUNTER — Encounter: Payer: Self-pay | Admitting: Gastroenterology

## 2022-01-25 ENCOUNTER — Other Ambulatory Visit: Payer: Self-pay

## 2022-01-25 ENCOUNTER — Ambulatory Visit (AMBULATORY_SURGERY_CENTER): Payer: BC Managed Care – PPO | Admitting: Gastroenterology

## 2022-01-25 ENCOUNTER — Telehealth: Payer: Self-pay

## 2022-01-25 VITALS — BP 127/77 | HR 84 | Temp 97.8°F | Resp 16 | Ht 67.0 in | Wt 169.0 lb

## 2022-01-25 DIAGNOSIS — Z1212 Encounter for screening for malignant neoplasm of rectum: Secondary | ICD-10-CM

## 2022-01-25 DIAGNOSIS — D123 Benign neoplasm of transverse colon: Secondary | ICD-10-CM

## 2022-01-25 DIAGNOSIS — R053 Chronic cough: Secondary | ICD-10-CM

## 2022-01-25 DIAGNOSIS — Z1211 Encounter for screening for malignant neoplasm of colon: Secondary | ICD-10-CM | POA: Diagnosis not present

## 2022-01-25 MED ORDER — SODIUM CHLORIDE 0.9 % IV SOLN
500.0000 mL | INTRAVENOUS | Status: DC
Start: 2022-01-25 — End: 2022-01-25

## 2022-01-25 NOTE — Patient Instructions (Addendum)
1 polyp removed and sent to pathology.  Await results for final recommendations.  Handouts on findings given to patient.  (Polyp, diverticulosis, hemorrhoids)  - Resume previous diet. - Continue present medications. - Await pathology results.  USTED TUVO UN PROCEDIMIENTO ENDOSCPICO HOY EN EL [facility]:   Lea el informe del procedimiento que se le entreg para cualquier pregunta especfica sobre lo que se Primary school teacher.  Si el informe del examen no responde a sus preguntas, por favor llame a su gastroenterlogo para aclararlo.  Si usted solicit que no se le den [employer] de lo que se Estate manager/land agent en su procedimiento al [employer] va a cuidar, entonces el informe del procedimiento se ha incluido en un sobre sellado para que usted lo revise despus cuando le sea ms conveniente.   LO QUE PUEDE ESPERAR: Algunas sensaciones de hinchazn en el abdomen.  Puede tener ms gases de lo normal.  El caminar puede ayudarle a eliminar el aire que se le puso en el tracto gastrointestinal durante el procedimiento y reducir la hinchazn.  Si le hicieron una endoscopia inferior (como una colonoscopia o una sigmoidoscopia flexible), podra notar manchas de sangre en las heces fecales o en el papel higinico.  Si se someti a una preparacin intestinal para su procedimiento, es posible que no tenga una evacuacin intestinal normal durante [employer].   Tenga en cuenta:  Es posible que note un poco de irritacin y congestin en la nariz o algn drenaje.  Esto es debido al oxgeno [employer] durante su procedimiento.  No hay que preocuparse y esto debe desaparecer ms o Scientist, research (medical).   SNTOMAS PARA REPORTAR INMEDIATAMENTE:  Despus de una endoscopia inferior (colonoscopia o sigmoidoscopia flexible):  Cantidades excesivas de sangre en las heces fecales  Sensibilidad significativa o empeoramiento de los dolores abdominales   Hinchazn aguda del abdomen que antes no tena   Fiebre de  100F o ms      Para asuntos urgentes o de Freight forwarder, puede comunicarse con un gastroenterlogo a cualquier hora llamando al [phone].  DIETA:  Recomendamos una comida pequea al principio, pero luego puede continuar con su dieta normal.  Tome muchos lquidos, Teacher, adult education las bebidas alcohlicas durante 24 horas.    ACTIVIDAD:  Debe planear tomarse las cosas con calma por el resto del da y no debe CONDUCIR ni usar maquinaria pesada Programmer, applications (debido a los medicamentos de sedacin utilizados durante el examen).     SEGUIMIENTO: Nuestro personal llamar al nmero que aparece en su historial al siguiente da hbil de su procedimiento para ver cmo se siente y para responder cualquier pregunta o inquietud que pueda tener con respecto a la informacin que se le dio despus del procedimiento. Si no podemos contactarle, le dejaremos un mensaje.  Sin embargo, si se siente bien y no tiene Paediatric nurse, no es necesario que nos devuelva la llamada.  Asumiremos que ha regresado a sus actividades diarias normales sin incidentes. Si se le tomaron algunas biopsias, le contactaremos por telfono o por carta en las prximas 3 semanas.  Si no ha sabido [employer] biopsias en el transcurso de 3 semanas, por favor llmenos al [phone].   FIRMAS/CONFIDENCIALIDAD: Usted y/o el acompaante que le cuide han firmado documentos que se ingresarn en su historial mdico electrnico.  Estas firmas atestiguan el hecho de que la informacin anterior

## 2022-01-25 NOTE — Telephone Encounter (Signed)
Referral entered in epic. 

## 2022-01-25 NOTE — Op Note (Signed)
[place] Patient Name: Derek Smith Procedure Date: 01/25/2022 3:01 PM MRN: [id number] Endoscopist: Derek Smith , MD, [id number] Age: 51 Referring MD:  Date of Birth: 1970/12/05 Gender: Male Account #: [id number] Procedure:                Colonoscopy Indications:              Screening for colorectal malignant neoplasm, This                            is the patient's first colonoscopy Medicines:                Monitored Anesthesia Care Procedure:                Pre-Anesthesia Assessment:                           - Prior to the procedure, a History and Physical                            was performed, and patient medications and                            allergies were reviewed. The patient's tolerance of                            previous anesthesia was also reviewed. The risks                            and benefits of the procedure and the sedation                            options and risks were discussed with the patient.                            All questions were answered, and informed consent                            was obtained. Prior Anticoagulants: The patient has                            taken no anticoagulant or antiplatelet agents. ASA                            Grade Assessment: II - A patient with mild systemic                            disease. After reviewing the risks and benefits,                            the patient was deemed in satisfactory condition to                            undergo the procedure.  After obtaining informed consent, the colonoscope                            was passed under direct vision. Throughout the                            procedure, the patient's blood pressure, pulse, and                            oxygen saturations were monitored continuously. The                            CF [id number] #[id number] was introduced through the anus                            and  advanced to the the cecum, identified by                            appendiceal orifice and ileocecal valve. The                            colonoscopy was performed without difficulty. The                            patient tolerated the procedure well. The quality                            of the bowel preparation was good. The ileocecal                            valve, appendiceal orifice, and rectum were                            photographed. Scope In: 3:22:44 PM Scope Out: 3:37:51 PM Scope Withdrawal Time: 0 hours 11 minutes 59 seconds  Total Procedure Duration: 0 hours 15 minutes 7 seconds  Findings:                 The perianal and digital rectal examinations were                            normal.                           A 3 mm polyp was found in the transverse colon. The                            polyp was sessile. The polyp was removed with a                            cold snare. Resection and retrieval were complete.                           A few small-mouthed diverticula were found in the  sigmoid colon.                           Internal hemorrhoids were found during retroflexion.                           The exam was otherwise without abnormality. Complications:            No immediate complications. Estimated blood loss:                            Minimal. Estimated Blood Loss:     Estimated blood loss was minimal. Impression:               - One 3 mm polyp in the transverse colon, removed                            with a cold snare. Resected and retrieved.                           - Diverticulosis in the sigmoid colon.                           - Internal hemorrhoids.                           - The examination was otherwise normal. Recommendation:           - Patient has a contact number available for                            emergencies. The signs and symptoms of potential                            delayed complications were  discussed with the                            patient. Return to normal activities tomorrow.                            Written discharge instructions were provided to the                            patient.                           - Resume previous diet.                           - Continue present medications.                           - Await pathology results. Derek Lipps P. Derek Coston, MD 01/25/2022 3:41:01 PM This report has been signed electronically.

## 2022-01-25 NOTE — Progress Notes (Signed)
Pt's states no medical or surgical changes since previsit or office visit. 

## 2022-01-25 NOTE — Telephone Encounter (Signed)
-----   Message from Yetta Flock, MD sent at 01/25/2022  3:41 PM EST ----- Herbert Seta can you please refer this patient to pulmonary for chronic cough. Thanks

## 2022-01-25 NOTE — Progress Notes (Signed)
[facility] History and Physical   Primary Care Physician:  Kerin Perna, NP   Reason for Procedure:   Colon cancer screening  Plan:    colonoscopy     HPI: Derek Smith is a 51 y.o. male  here for colonoscopy screening  . Patient denies any bowel symptoms at this time. No family history of colon cancer known. Otherwise feels well without any cardiopulmonary symptoms.   I have discussed risks / benefits of anesthesia and endoscopic procedure with Derek Smith and they wish to proceed with the exams as outlined today.    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    Syncope 03/28/2016    Past Surgical History:  Procedure Laterality Date   41 HOUR [place] STUDY N/A 05/08/2018   Procedure: 24 HOUR PH STUDY;  Surgeon: Mauri Pole, MD;  [facility];  Service: Endoscopy;  Laterality: N/A;   ESOPHAGEAL MANOMETRY N/A 05/08/2018   Procedure: ESOPHAGEAL MANOMETRY (EM);  Surgeon: Mauri Pole, MD;  [facility];  Service: Endoscopy;  Laterality: N/A;   LEFT HEART CATH AND CORONARY ANGIOGRAPHY N/A 05/22/2016   Procedure: Left Heart Cath and Coronary Angiography;  Surgeon: Jettie Booze, MD;  Location: [facility];  Service: Cardiovascular;  Laterality: N/A;    Prior to Admission medications   Medication Sig Start Date End Date Taking? Authorizing Provider  albuterol (VENTOLIN HFA) 108 (90 Base) MCG/ACT inhaler Inhale 2 puffs into the lungs every 6 (six) hours as needed for wheezing or shortness of breath. 09/30/21  Yes Kerin Perna, NP  pantoprazole (PROTONIX) 40 MG tablet Take 1 tablet (40 mg total) by mouth 2 (two) times daily. 01/09/22  Yes Levin Erp, PA  PEG-KCl-NaCl-NaSulf-Na Asc-C (PLENVU) 140 g SOLR Take 1 kit by mouth as directed. 01/09/22  Yes Levin Erp, PA  EPINEPHrine (EPIPEN 2-PAK) 0.3 mg/0.3 mL IJ SOAJ  injection Inject 0.3 mg into the muscle as needed for anaphylaxis. 07/13/20   Kerin Perna, NP    Current Outpatient Medications  Medication Sig Dispense Refill   albuterol (VENTOLIN HFA) 108 (90 Base) MCG/ACT inhaler Inhale 2 puffs into the lungs every 6 (six) hours as needed for wheezing or shortness of breath. 8.5 g 0   pantoprazole (PROTONIX) 40 MG tablet Take 1 tablet (40 mg total) by mouth 2 (two) times daily. 60 tablet 3   PEG-KCl-NaCl-NaSulf-Na Asc-C (PLENVU) 140 g SOLR Take 1 kit by mouth as directed. 3 each 0   EPINEPHrine (EPIPEN 2-PAK) 0.3 mg/0.3 mL IJ SOAJ injection Inject 0.3 mg into the muscle as needed for anaphylaxis. 2 each 1   Current Facility-Administered Medications  Medication Dose Route Frequency Provider Last Rate Last Admin   0.9 %  sodium chloride infusion  500 mL Intravenous Continuous Castle Lamons, Carlota Raspberry, MD        Allergies as of 01/25/2022   (No Active Allergies)    Family History  Problem Relation Age of Onset   GI problems Mother    Ovarian cancer Mother    Alzheimer's disease Father    Allergic rhinitis Neg Hx    Eczema Neg Hx    Asthma Neg Hx    Urticaria Neg Hx    Pancreatic cancer Neg Hx    Stomach cancer Neg Hx    Colon cancer Neg Hx    Esophageal cancer Neg Hx  Social History   Socioeconomic History   Marital status: Divorced    Spouse name: Not on file   Number of children: 1   Years of education: Not on file   Highest education level: Not on file  Occupational History   Not on file  Tobacco Use   Smoking status: Never    Passive exposure: Current   Smokeless tobacco: Never  Vaping Use   Vaping Use: Never used  Substance and Sexual Activity   Alcohol use: No   Drug use: No   Sexual activity: Yes  Other Topics Concern   Not on file  Social History Narrative   Not on file   Social Determinants of Health   Financial Resource Strain: Not on file  Food Insecurity: Not on file  Transportation Needs: Not on file   Physical Activity: Not on file  Stress: Not on file  Social Connections: Not on file  Intimate Partner Violence: Not on file    Review of Systems: All other review of systems negative except as mentioned in the HPI.  Physical Exam: Vital signs BP 127/64   Pulse 82   Temp 97.8 F (36.6 C) (Temporal)   Ht _0  (1.702 m)   Wt 169 lb (76.7 kg)   SpO2 96%   BMI 26.47 kg/m   General:   Alert,  Well-developed, pleasant and cooperative in NAD Lungs:  Clear throughout to auscultation.   Heart:  Regular rate and rhythm Abdomen:  Soft, nontender and nondistended.   Neuro/Psych:  Alert and cooperative. Normal mood and affect. A and O x 3  Jolly Mango, MD [facility]

## 2022-01-25 NOTE — Progress Notes (Signed)
Called to room to assist during endoscopic procedure.  Patient ID and intended procedure confirmed with present staff. Received instructions for my participation in the procedure from the performing physician.  

## 2022-01-26 ENCOUNTER — Ambulatory Visit (INDEPENDENT_AMBULATORY_CARE_PROVIDER_SITE_OTHER): Payer: Self-pay

## 2022-01-26 ENCOUNTER — Telehealth: Payer: Self-pay

## 2022-01-26 NOTE — Telephone Encounter (Signed)
Message from [place] sent at 01/26/2022  8:18 AM EST  Summary: chest pain and throat pain due to severe cough   error  ----- Message from Sherron Flemings sent at 01/26/2022  8:17 AM EST ----- Pt states he has chest pain and throat pain due to a severe cough  Pt requesting a referral for a "lung doctor"  Pt needs an interpreter  Please assist further         Chief Complaint: chronic cough Symptoms: nasal congestion, hoarse, SOB after coughing, chest pain that hurts when coughing, sore throat, hoarse Frequency: cough x 1 year Pertinent Negatives: Patient denies fever, wheezing Disposition: '[]'$ ED /'[]'$ Urgent Care (no appt availability in office) / '[]'$ Appointment(In office/virtual)/ '[]'$  [facility]/ '[]'$ Home Care/ '[]'$ Refused Recommended Disposition /'[]'$ [facility]/ '[x]'$  Follow-up with PCP Additional Notes: pt stated prescribed meds are not helping. Pt request a referral to a pulmonologist.   Reason for Disposition  Cough has been present for > 3 weeks  Answer Assessment - Initial Assessment Questions 1. ONSET: "When did the cough begin?"      1 year  2. SEVERITY: "How bad is the cough today?"      frequent 3. SPUTUM: "Describe the color of your sputum" (none, dry cough; clear, white, yellow, green)     none 4. HEMOPTYSIS: "Are you coughing up any blood?" If so ask: "How much?" (flecks, streaks, tablespoons, etc.)     N/a 5. DIFFICULTY BREATHING: "Are you having difficulty breathing?" If Yes, ask: "How bad is it?" (e.g., mild, moderate, severe)    - MILD: No SOB at rest, mild SOB with walking, speaks normally in sentences, can lie down, no retractions, pulse < 100.    - MODERATE: SOB at rest, SOB with minimal exertion and prefers to sit, cannot lie down flat, speaks in phrases, mild retractions, audible wheezing, pulse 100-120.    - SEVERE: Very SOB at rest, speaks in single words, struggling to breathe, sitting hunched forward, retractions, pulse > 120       coughing 6. FEVER: "Do you have a fever?" If Yes, ask: "What is your temperature, how was it measured, and when did it start?"     no 7. CARDIAC HISTORY: "Do you have any history of heart disease?" (e.g., heart attack, congestive heart failure)      H/o angina 8. LUNG HISTORY: "Do you have any history of lung disease?"  (e.g., pulmonary embolus, asthma, emphysema)     Asthma,  9. PE RISK FACTORS: "Do you have a history of blood clots?" (or: recent major surgery, recent prolonged travel, bedridden)     no 10. OTHER SYMPTOMS: "Do you have any other symptoms?" (e.g., runny nose, wheezing, chest pain)       Sore throat- 10/10 tonsils swollen- chest pain=with coughing 11. PREGNANCY: "Is there any chance you are pregnant?" "When was your last menstrual period?"       N/a 12. TRAVEL: "Have you traveled out of the country in the last month?" (e.g., travel history, exposures)       no  Protocols used: Cough - Acute Non-Productive-A-AH

## 2022-01-26 NOTE — Telephone Encounter (Signed)
  Follow up Call-     01/25/2022    2:06 PM  Call back number  Post procedure Call Back phone  # [phone] ok to speak to friend Shonna Chock  Permission to leave phone message Yes     Patient questions:  Do you have a fever, pain , or abdominal swelling? No. Pain Score  0 *  Have you tolerated food without any problems? Yes.    Have you been able to return to your normal activities? Yes.    Do you have any questions about your discharge instructions: Diet   No. Medications  No. Follow up visit  No.  Do you have questions or concerns about your Care? No.  Actions: * If pain score is 4 or above: No action needed, pain <4.

## 2022-01-30 ENCOUNTER — Encounter: Payer: Self-pay | Admitting: Gastroenterology

## 2022-03-07 ENCOUNTER — Ambulatory Visit: Payer: BC Managed Care – PPO | Admitting: Internal Medicine

## 2022-03-17 ENCOUNTER — Encounter: Payer: Self-pay | Admitting: Pulmonary Disease

## 2022-03-17 ENCOUNTER — Ambulatory Visit (INDEPENDENT_AMBULATORY_CARE_PROVIDER_SITE_OTHER): Payer: BC Managed Care – PPO | Admitting: Pulmonary Disease

## 2022-03-17 ENCOUNTER — Other Ambulatory Visit: Payer: Self-pay

## 2022-03-17 VITALS — BP 116/60 | HR 85 | Ht 67.0 in | Wt 170.0 lb

## 2022-03-17 DIAGNOSIS — R053 Chronic cough: Secondary | ICD-10-CM

## 2022-03-17 MED ORDER — FLUTICASONE-SALMETEROL 115-21 MCG/ACT IN AERO
2.0000 | INHALATION_SPRAY | Freq: Two times a day (BID) | RESPIRATORY_TRACT | 12 refills | Status: DC
  Filled 2022-03-17: qty 12, 30d supply, fill #0

## 2022-03-17 MED ORDER — PREDNISONE 10 MG PO TABS
ORAL_TABLET | ORAL | 0 refills | Status: AC
  Filled 2022-03-17: qty 30, 12d supply, fill #0

## 2022-03-17 NOTE — Patient Instructions (Signed)
Empieza advair inhalador 2 pompas, dos veces al dia   Toma prednisone: 4 pastillas cada dia x 3 dias 3 pastillas cada dia x 3 dias 2 pastillas cada dia x 3 dias 1 pastillas cada dia x 3 dias  Te Mando a Speech Therapy por un estudia de pasar   Follow up in 1 month

## 2022-03-17 NOTE — Progress Notes (Signed)
Synopsis: Referred in January 2024 for chronic cough by [place] Cellar, MD  Subjective:   PATIENT ID: Derek Smith GENDER: male DOB: 1970-09-15, MRN: [id number]   HPI  Chief Complaint  Patient presents with   Consult    Referred by GI for chronic cough. States he has not been able to cough up any phlegm. Increased SOB and chest pressure.    Derek Smith is a 52 year old male, never smoker with asthma who is referred to pulmonary clinic for chronic cough.   He reports cough over the past year. The cough is dry. The cough does wake him up at night. He constantly feels like he has an itch in his throat. He has a sore throat. He denies post-nasal drainage. He denies symptoms from GERD. The cough is worse when eating. He feels like food and drink can stick in his throat. He denies issues with seasonal allergies. He has lived in [place] for 10 years. He was in [place] for 6 months last year and the cough went away. He denies water damage or mold at home. He works in a lab with dusts and chemicals. He denies worsening in his cough while at work.   He is a never smoker or vaper.   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    Syncope 03/28/2016     Family History  Problem Relation Age of Onset   GI problems Mother    Ovarian cancer Mother    Alzheimer's disease Father    Allergic rhinitis Neg Hx    Eczema Neg Hx    Asthma Neg Hx    Urticaria Neg Hx    Pancreatic cancer Neg Hx    Stomach cancer Neg Hx    Colon cancer Neg Hx    Esophageal cancer Neg Hx      Social History   Socioeconomic History   Marital status: Divorced    Spouse name: Not on file   Number of children: 1   Years of education: Not on file   Highest education level: Not on file  Occupational History   Not on file  Tobacco Use   Smoking status: Never    Passive exposure: Current   Smokeless tobacco: Never  Vaping Use    Vaping Use: Never used  Substance and Sexual Activity   Alcohol use: No   Drug use: No   Sexual activity: Yes  Other Topics Concern   Not on file  Social History Narrative   Not on file   Social Determinants of Health   Financial Resource Strain: Not on file  Food Insecurity: Not on file  Transportation Needs: Not on file  Physical Activity: Not on file  Stress: Not on file  Social Connections: Not on file  Intimate Partner Violence: Not on file     No Known Allergies   Outpatient Medications Prior to Visit  Medication Sig Dispense Refill   albuterol (VENTOLIN HFA) 108 (90 Base) MCG/ACT inhaler Inhale 2 puffs into the lungs every 6 (six) hours as needed for wheezing or shortness of breath. 8.5 g 0   montelukast (SINGULAIR) 10 MG tablet Take 10 mg by mouth at bedtime.     omeprazole (PRILOSEC) 40 MG capsule Take 40 mg by mouth daily.     EPINEPHrine (EPIPEN 2-PAK) 0.3 mg/0.3 mL IJ SOAJ injection Inject 0.3 mg into the muscle as needed for  anaphylaxis. 2 each 1   pantoprazole (PROTONIX) 40 MG tablet Take 1 tablet (40 mg total) by mouth 2 (two) times daily. 60 tablet 3   PEG-KCl-NaCl-NaSulf-Na Asc-C (PLENVU) 140 g SOLR Take 1 kit by mouth as directed. 3 each 0   No facility-administered medications prior to visit.    Review of Systems  Constitutional:  Negative for chills, fever, malaise/fatigue and weight loss.  HENT:  Positive for congestion. Negative for sinus pain and sore throat.   Eyes: Negative.   Respiratory:  Positive for cough, sputum production and shortness of breath. Negative for hemoptysis and wheezing.   Cardiovascular:  Negative for chest pain, palpitations, orthopnea, claudication and leg swelling.  Gastrointestinal:  Positive for heartburn. Negative for abdominal pain, nausea and vomiting.  Genitourinary: Negative.   Musculoskeletal:  Negative for joint pain and myalgias.  Skin:  Negative for rash.  Neurological:  Negative for weakness.   Endo/Heme/Allergies: Negative.   Psychiatric/Behavioral: Negative.     Objective:   Vitals:   03/17/22 1356  BP: 116/60  Pulse: 85  SpO2: 98%  Weight: 170 lb (77.1 kg)  Height: '5\' 7"'$  (1.702 m)   Physical Exam Constitutional:      General: He is not in acute distress. HENT:     Head: Normocephalic and atraumatic.     Mouth/Throat:     Mouth: Mucous membranes are moist.     Pharynx: Posterior oropharyngeal erythema present.  Eyes:     Extraocular Movements: Extraocular movements intact.     Conjunctiva/sclera: Conjunctivae normal.     Pupils: Pupils are equal, round, and reactive to light.  Cardiovascular:     Rate and Rhythm: Normal rate and regular rhythm.     Pulses: Normal pulses.     Heart sounds: Normal heart sounds. No murmur heard. Abdominal:     General: Bowel sounds are normal.     Palpations: Abdomen is soft.  Musculoskeletal:     Right lower leg: No edema.     Left lower leg: No edema.  Lymphadenopathy:     Cervical: No cervical adenopathy.  Skin:    General: Skin is warm and dry.  Neurological:     General: No focal deficit present.     Mental Status: He is alert.  Psychiatric:        Mood and Affect: Mood normal.        Behavior: Behavior normal.        Thought Content: Thought content normal.        Judgment: Judgment normal.    CBC    Component Value Date/Time   WBC 10.4 01/11/2021 0522   RBC 4.72 01/11/2021 0522   HGB 14.8 01/11/2021 0522   HGB 14.5 10/04/2016 1533   HCT 44.3 01/11/2021 0522   HCT 43.3 10/04/2016 1533   PLT 228 01/11/2021 0522   PLT 260 10/04/2016 1533   MCV 93.9 01/11/2021 0522   MCV 91 10/04/2016 1533   MCH 31.4 01/11/2021 0522   MCHC 33.4 01/11/2021 0522   RDW 11.6 01/11/2021 0522   RDW 12.7 10/04/2016 1533   LYMPHSABS 2.2 01/11/2021 0522   LYMPHSABS 2.3 10/04/2016 1533   MONOABS 1.0 01/11/2021 0522   EOSABS 0.0 01/11/2021 0522   EOSABS 0.2 10/04/2016 1533   BASOSABS 0.0 01/11/2021 0522   BASOSABS 0.0  10/04/2016 1533      Latest Ref Rng & Units 01/11/2021    5:22 AM 09/21/2017    2:15 PM 03/22/2017   12:12 PM  BMP  Glucose 70 - 99 mg/dL 119  108  94   BUN 6 - 20 mg/dL '16  13  16   '$ Creatinine 0.61 - 1.24 mg/dL 1.07  1.00  1.00   Sodium 135 - 145 mmol/L 138  141  141   Potassium 3.5 - 5.1 mmol/L 3.0  3.6  4.3   Chloride 98 - 111 mmol/L 101  104  100   CO2 22 - 32 mmol/L 28     Calcium 8.9 - 10.3 mg/dL 9.3      Chest imaging: CXR 01/11/21 Lung volumes are normal. No consolidative airspace disease. No pleural effusions. No pneumothorax. No pulmonary nodule or mass noted. Pulmonary vasculature and the cardiomediastinal silhouette are within normal limits.  PFT:     No data to display        [facility]  Labs:  Path:  Echo:  Heart Catheterization:    Assessment & Plan:   Chronic cough - Plan: fluticasone-salmeterol (ADVAIR HFA) 115-21 MCG/ACT inhaler, predniSONE (DELTASONE) 10 MG tablet, SLP modified barium swallow  Discussion: Derek Smith is a 52 year old male, never smoker with asthma who is referred to pulmonary clinic for chronic cough.   His cough could be related to upper airway cough syndrome from clearing his throat constantly vs reactive airways disease vs dysphagia vs GERD.   We will start him on advair inhaler 2 puffs twice daily and prednisone taper. We will monitor for improvement in his symptoms.   We will refer him to speech therapy for modified barium swallow.   If cough is no better in the future, he may benefit from ENT evaluation.   Follow up in 1 month.  Freda Jackson, MD [facility] Office: [phone]    Current Outpatient Medications:    albuterol (VENTOLIN HFA) 108 (90 Base) MCG/ACT inhaler, Inhale 2 puffs into the lungs every 6 (six) hours as needed for wheezing or shortness of breath., Disp: 8.5 g, Rfl: 0   fluticasone-salmeterol (ADVAIR HFA) 115-21  MCG/ACT inhaler, Inhale 2 puffs into the lungs 2 (two) times daily., Disp: 12 g, Rfl: 12   montelukast (SINGULAIR) 10 MG tablet, Take 10 mg by mouth at bedtime., Disp: , Rfl:    omeprazole (PRILOSEC) 40 MG capsule, Take 40 mg by mouth daily., Disp: , Rfl:    predniSONE (DELTASONE) 10 MG tablet, Take 4 tablets (40 mg total) by mouth daily with breakfast for 3 days, THEN 3 tablets (30 mg total) daily with breakfast for 3 days, THEN 2 tablets (20 mg total) daily with breakfast for 3 days, THEN 1 tablet (10 mg total) daily with breakfast for 3 days., Disp: 30 tablet, Rfl: 0

## 2022-03-21 ENCOUNTER — Other Ambulatory Visit (HOSPITAL_COMMUNITY): Payer: Self-pay

## 2022-03-21 DIAGNOSIS — R1310 Dysphagia, unspecified: Secondary | ICD-10-CM

## 2022-04-03 ENCOUNTER — Ambulatory Visit (HOSPITAL_COMMUNITY)
Admission: RE | Admit: 2022-04-03 | Discharge: 2022-04-03 | Disposition: A | Discharge status: Home / Self Care | Payer: BC Managed Care – PPO | Source: Ambulatory Visit | Attending: Primary Care | Admitting: Primary Care

## 2022-04-03 DIAGNOSIS — K219 Gastro-esophageal reflux disease without esophagitis: Secondary | ICD-10-CM | POA: Diagnosis not present

## 2022-04-03 DIAGNOSIS — R1310 Dysphagia, unspecified: Secondary | ICD-10-CM | POA: Diagnosis not present

## 2022-04-03 DIAGNOSIS — J45909 Unspecified asthma, uncomplicated: Secondary | ICD-10-CM | POA: Insufficient documentation

## 2022-04-03 DIAGNOSIS — R053 Chronic cough: Secondary | ICD-10-CM

## 2022-04-17 ENCOUNTER — Other Ambulatory Visit: Payer: Self-pay

## 2022-04-17 ENCOUNTER — Ambulatory Visit (INDEPENDENT_AMBULATORY_CARE_PROVIDER_SITE_OTHER): Payer: BC Managed Care – PPO | Admitting: Pulmonary Disease

## 2022-04-17 ENCOUNTER — Encounter: Payer: Self-pay | Admitting: Pulmonary Disease

## 2022-04-17 VITALS — BP 118/72 | HR 77 | Ht 67.0 in | Wt 166.0 lb

## 2022-04-17 DIAGNOSIS — R000 Tachycardia, unspecified: Secondary | ICD-10-CM

## 2022-04-17 DIAGNOSIS — R053 Chronic cough: Secondary | ICD-10-CM

## 2022-04-17 MED ORDER — ALBUTEROL SULFATE HFA 108 (90 BASE) MCG/ACT IN AERS
2.0000 | INHALATION_SPRAY | Freq: Four times a day (QID) | RESPIRATORY_TRACT | 11 refills | Status: AC | PRN
  Filled 2022-04-17 – 2022-08-09 (×2): qty 8.5, 25d supply, fill #0

## 2022-04-17 NOTE — Patient Instructions (Addendum)
Use albuterol inhaler 1-2 puffs every 4-6 hours as needed for cough, wheezing or shortness of breath  I am glad your cough has resolved with the prednisone.  We will refer you to cardiology for the symptoms of heart racing with exertion  We will schedule you for spirometry  Follow up in 2 months

## 2022-04-17 NOTE — Progress Notes (Signed)
Synopsis: Referred in January 2024 for chronic cough by [place] Cellar, MD  Subjective:   PATIENT ID: Derek Smith GENDER: male DOB: 07-Jun-1970, MRN: [id number]  HPI  Chief Complaint  Patient presents with   Follow-up    1 mo f/u for cough. States the cough is much better but has noticed some left upper back pain. Described as a dull ache.    Derek Smith is a 52 year old male, never smoker with asthma who returns to pulmonary clinic for chronic cough.   His cough has resolved with prednisone taper and as needed albuterol inhaler. He was not able to pick up advair inhaler due to cost.   He has heart racing with exertional work over the past year. This is new for him. He has sweating with the heart racing. He denies chest pain. No family history of heart disease. He would prefer to be evaluated by a heart doctor.  MBS study was unremarkable.   Initial OV 03/17/22 He reports cough over the past year. The cough is dry. The cough does wake him up at night. He constantly feels like he has an itch in his throat. He has a sore throat. He denies post-nasal drainage. He denies symptoms from GERD. The cough is worse when eating. He feels like food and drink can stick in his throat. He denies issues with seasonal allergies. He has lived in [place] for 10 years. He was in [place] for 6 months last year and the cough went away. He denies water damage or mold at home. He works in a lab with dusts and chemicals. He denies worsening in his cough while at work.   He is a never smoker or vaper.   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    Syncope 03/28/2016     Family History  Problem Relation Age of Onset   GI problems Mother    Ovarian cancer Mother    Alzheimer's disease Father    Allergic rhinitis Neg Hx    Eczema Neg Hx    Asthma Neg Hx    Urticaria Neg Hx    Pancreatic cancer Neg Hx    Stomach cancer  Neg Hx    Colon cancer Neg Hx    Esophageal cancer Neg Hx      Social History   Socioeconomic History   Marital status: Divorced    Spouse name: Not on file   Number of children: 1   Years of education: Not on file   Highest education level: Not on file  Occupational History   Not on file  Tobacco Use   Smoking status: Never    Passive exposure: Current   Smokeless tobacco: Never  Vaping Use   Vaping Use: Never used  Substance and Sexual Activity   Alcohol use: No   Drug use: No   Sexual activity: Yes  Other Topics Concern   Not on file  Social History Narrative   Not on file   Social Determinants of Health   Financial Resource Strain: Not on file  Food Insecurity: Not on file  Transportation Needs: Not on file  Physical Activity: Not on file  Stress: Not on file  Social Connections: Not on file  Intimate Partner Violence: Not on file     No Known Allergies   Outpatient Medications Prior to Visit  Medication Sig Dispense Refill   montelukast (  SINGULAIR) 10 MG tablet Take 10 mg by mouth at bedtime.     omeprazole (PRILOSEC) 40 MG capsule Take 40 mg by mouth daily.     albuterol (VENTOLIN HFA) 108 (90 Base) MCG/ACT inhaler Inhale 2 puffs into the lungs every 6 (six) hours as needed for wheezing or shortness of breath. 8.5 g 0   fluticasone-salmeterol (ADVAIR HFA) 115-21 MCG/ACT inhaler Inhale 2 puffs into the lungs 2 (two) times daily. (Patient not taking: Reported on 04/17/2022) 12 g 12   No facility-administered medications prior to visit.    Review of Systems  Constitutional:  Negative for chills, fever, malaise/fatigue and weight loss.  HENT:  Negative for congestion, sinus pain and sore throat.   Eyes: Negative.   Respiratory:  Positive for shortness of breath (with exertion). Negative for cough, hemoptysis, sputum production and wheezing.   Cardiovascular:  Negative for chest pain, palpitations, orthopnea, claudication and leg swelling.       Heart racing   Gastrointestinal:  Negative for abdominal pain, heartburn, nausea and vomiting.  Genitourinary: Negative.   Musculoskeletal:  Negative for joint pain and myalgias.  Skin:  Negative for rash.  Neurological:  Negative for weakness.  Endo/Heme/Allergies: Negative.   Psychiatric/Behavioral: Negative.     Objective:   Vitals:   04/17/22 1542  BP: 118/72  Pulse: 77  SpO2: 97%  Weight: 166 lb (75.3 kg)  Height: '5\' 7"'$  (1.702 m)   Physical Exam Constitutional:      General: He is not in acute distress. HENT:     Head: Normocephalic and atraumatic.     Mouth/Throat:     Mouth: Mucous membranes are moist.  Eyes:     Conjunctiva/sclera: Conjunctivae normal.  Cardiovascular:     Rate and Rhythm: Normal rate and regular rhythm.     Pulses: Normal pulses.     Heart sounds: Normal heart sounds. No murmur heard. Musculoskeletal:     Right lower leg: No edema.     Left lower leg: No edema.  Skin:    General: Skin is warm and dry.  Neurological:     General: No focal deficit present.     Mental Status: He is alert.  Psychiatric:        Mood and Affect: Mood normal.        Behavior: Behavior normal.        Thought Content: Thought content normal.        Judgment: Judgment normal.    CBC    Component Value Date/Time   WBC 10.4 01/11/2021 0522   RBC 4.72 01/11/2021 0522   HGB 14.8 01/11/2021 0522   HGB 14.5 10/04/2016 1533   HCT 44.3 01/11/2021 0522   HCT 43.3 10/04/2016 1533   PLT 228 01/11/2021 0522   PLT 260 10/04/2016 1533   MCV 93.9 01/11/2021 0522   MCV 91 10/04/2016 1533   MCH 31.4 01/11/2021 0522   MCHC 33.4 01/11/2021 0522   RDW 11.6 01/11/2021 0522   RDW 12.7 10/04/2016 1533   LYMPHSABS 2.2 01/11/2021 0522   LYMPHSABS 2.3 10/04/2016 1533   MONOABS 1.0 01/11/2021 0522   EOSABS 0.0 01/11/2021 0522   EOSABS 0.2 10/04/2016 1533   BASOSABS 0.0 01/11/2021 0522   BASOSABS 0.0 10/04/2016 1533      Latest Ref Rng & Units 01/11/2021    5:22 AM 09/21/2017    2:15 PM  03/22/2017   12:12 PM  BMP  Glucose 70 - 99 mg/dL 119  108  94  BUN 6 - 20 mg/dL '16  13  16   '$ Creatinine 0.61 - 1.24 mg/dL 1.07  1.00  1.00   Sodium 135 - 145 mmol/L 138  141  141   Potassium 3.5 - 5.1 mmol/L 3.0  3.6  4.3   Chloride 98 - 111 mmol/L 101  104  100   CO2 22 - 32 mmol/L 28     Calcium 8.9 - 10.3 mg/dL 9.3      Chest imaging: CXR 01/11/21 Lung volumes are normal. No consolidative airspace disease. No pleural effusions. No pneumothorax. No pulmonary nodule or mass noted. Pulmonary vasculature and the cardiomediastinal silhouette are within normal limits.  PFT:     No data to display        [facility]rometry [facility]20 [facility]  Labs:  Path:  Echo:  Heart Catheterization:  MBS 04/03/22 Pt presented with a normal oropharyngeal swallow with adequate oral preparation/propulsion, timely swallow, good pharyngeal clearance and no aspiration/penetration noted with any consistencies assessed. These included: thin liquids, nectar-thick liquids, honey-thick liquids, puree, soft solids and barium tablet/thin liquids. No retrograde and/or regurgitation/UES restriction/decreased opening observed during this small sample; however, pt could potentially be at risk intermittently for post prandial aspiration d/t reported esophageal symptoms. Pt may continue pt preferred solids/thin liquids with esophageal preautions in place during meals including upright positioning during and after meals for 30-45 minutes, small bites/sips and slow rate during PO consumption. Pt may benefit from ENT referral/assessment and/or further GI assessment for reported esophageal symptoms. Thank you for this consult.   Assessment & Plan:   Racing heart beat - Plan: Ambulatory referral to Cardiology  Chronic cough - Plan: Pulmonary Function Test, albuterol (VENTOLIN HFA) 108 (90 Base) MCG/ACT inhaler  Discussion: Derek Smith is a 52 year old male, never smoker with asthma  who returns to pulmonary clinic for chronic cough.   His cough was likely related to upper airway cough syndrome from clearing his throat constantly vs reactive airways disease.  He can continue to use albuterol inhaler 1-2 puffs every 4-6 hours as needed. Will schedule him for spirometry.  Will refer him to cardiology per his request to be evaluated for episodes of heart racing and diaphoresis.   Follow up in 2 months.  Freda Jackson, MD [facility] Office: [phone]   Current Outpatient Medications:    montelukast (SINGULAIR) 10 MG tablet, Take 10 mg by mouth at bedtime., Disp: , Rfl:    omeprazole (PRILOSEC) 40 MG capsule, Take 40 mg by mouth daily., Disp: , Rfl:    albuterol (VENTOLIN HFA) 108 (90 Base) MCG/ACT inhaler, Inhale 2 puffs into the lungs every 6 (six) hours as needed for wheezing or shortness of breath., Disp: 8.5 g, Rfl: 11

## 2022-04-21 ENCOUNTER — Other Ambulatory Visit: Payer: Self-pay

## 2022-04-24 ENCOUNTER — Other Ambulatory Visit (HOSPITAL_COMMUNITY): Payer: Self-pay

## 2022-05-02 ENCOUNTER — Encounter: Payer: Self-pay | Admitting: General Practice

## 2022-05-17 ENCOUNTER — Telehealth: Payer: Self-pay | Admitting: Pulmonary Disease

## 2022-05-17 NOTE — Telephone Encounter (Signed)
The cheapest ICS/LABA at this time for the patient is the Wixela/Generic Advair Diskus.

## 2022-05-17 NOTE — Telephone Encounter (Signed)
-----   Message from Jodean Lima, CPhT sent at 04/24/2022 12:29 PM EST ----- Regarding: RE: ICS/LABA inhaler coverage The cheapest ICS/LABA at this time for the patient is the Wixela/Generic Advair Diskus.  ----- Message ----- From: Freddi Starr, MD Sent: 04/17/2022   4:16 PM EST To: Rx Prior Auth Team Subject: ICS/LABA inhaler coverage                      Hello,  Which ICS/LABA inhaler is best covered by his insurance?   Thanks, Wille Glaser

## 2022-06-08 ENCOUNTER — Other Ambulatory Visit: Payer: Self-pay

## 2022-06-08 ENCOUNTER — Encounter (HOSPITAL_COMMUNITY): Payer: Self-pay

## 2022-06-08 ENCOUNTER — Other Ambulatory Visit (HOSPITAL_COMMUNITY): Payer: Self-pay

## 2022-06-08 ENCOUNTER — Emergency Department (HOSPITAL_COMMUNITY)
Admission: EM | Admit: 2022-06-08 | Discharge: 2022-06-08 | Disposition: A | Discharge status: Home / Self Care | Payer: BC Managed Care – PPO | Attending: Emergency Medicine | Admitting: Emergency Medicine

## 2022-06-08 DIAGNOSIS — M5441 Lumbago with sciatica, right side: Secondary | ICD-10-CM | POA: Diagnosis not present

## 2022-06-08 DIAGNOSIS — M545 Low back pain, unspecified: Secondary | ICD-10-CM | POA: Diagnosis not present

## 2022-06-08 MED ORDER — LIDOCAINE 4 % EX PTCH
1.0000 | MEDICATED_PATCH | CUTANEOUS | 0 refills | Status: DC
  Filled 2022-06-08: qty 30, 30d supply, fill #0

## 2022-06-08 MED ORDER — NAPROXEN 500 MG PO TABS
500.0000 mg | ORAL_TABLET | Freq: Two times a day (BID) | ORAL | 0 refills | Status: AC | PRN
  Filled 2022-06-08: qty 30, 15d supply, fill #0

## 2022-06-08 MED ORDER — LIDOCAINE 5 % EX PTCH
1.0000 | MEDICATED_PATCH | CUTANEOUS | Status: DC
  Administered 2022-06-08: 1 via TRANSDERMAL
  Filled 2022-06-08: qty 1

## 2022-06-08 MED ORDER — CYCLOBENZAPRINE HCL 5 MG PO TABS
5.0000 mg | ORAL_TABLET | Freq: Three times a day (TID) | ORAL | 0 refills | Status: DC | PRN
  Filled 2022-06-08: qty 30, 10d supply, fill #0

## 2022-06-08 MED ORDER — CYCLOBENZAPRINE HCL 10 MG PO TABS
5.0000 mg | ORAL_TABLET | Freq: Once | ORAL | Status: AC
  Administered 2022-06-08: 5 mg via ORAL
  Filled 2022-06-08: qty 1

## 2022-06-08 MED ORDER — KETOROLAC TROMETHAMINE 30 MG/ML IJ SOLN
30.0000 mg | Freq: Once | INTRAMUSCULAR | Status: AC
  Administered 2022-06-08: 30 mg via INTRAMUSCULAR
  Filled 2022-06-08: qty 1

## 2022-06-08 NOTE — ED Provider Notes (Signed)
[place] EMERGENCY DEPARTMENT AT [facility] Provider Note   CSN: [id number] Arrival date & time: 06/08/22  1313     History  Chief Complaint  Patient presents with   Back Pain    Derek Smith is a 52 y.o. male, no pertinent past medical history, who presents to the ED secondary to right buttocks pain, radiating down the right leg that has been going on for the last few days.  Notes that have been following up with couple days ago, and has had stabbing pain in his right buttock, back, radiating down the right leg.  Denies any weakness, loss of urine, or bowel.  Denies any trauma.  States that he had this happen before, but it has not been as painful.  Pain is moderate to severe in nature.  Has tried Tylenol with some relief.  Denies any chest pain or abdominal pain.     Home Medications Prior to Admission medications   Medication Sig Start Date End Date Taking? Authorizing Provider  cyclobenzaprine (FLEXERIL) 5 MG tablet Take 1 tablet (5 mg total) by mouth 3 (three) times daily as needed for muscle spasms. 06/08/22  Yes Florian Chauca L, PA  lidocaine (HM LIDOCAINE PATCH) 4 % Place 1 patch onto the skin daily. 06/08/22  Yes Addaline Peplinski L, PA  naproxen (NAPROSYN) 500 MG tablet Take 1 tablet (500 mg total) by mouth 2 (two) times daily as needed. 06/08/22  Yes Maahir Horst L, PA  albuterol (VENTOLIN HFA) 108 (90 Base) MCG/ACT inhaler Inhale 2 puffs into the lungs every 6 (six) hours as needed for wheezing or shortness of breath. 04/17/22   Freddi Starr, MD  montelukast (SINGULAIR) 10 MG tablet Take 10 mg by mouth at bedtime.    [provider]  omeprazole (PRILOSEC) 40 MG capsule Take 40 mg by mouth daily.    [provider]      Allergies    Patient has no known allergies.    Review of Systems   Review of Systems  Constitutional:  Negative for fever.  Musculoskeletal:  Positive for back pain.    Physical Exam Updated Vital Signs BP (!)  131/95 (BP Location: Left Arm)   Pulse 80   Temp 98 F (36.7 C)   Resp 19   Ht 5\' 7"  (1.702 m)   Wt 73 kg   SpO2 100%   BMI 25.21 kg/m  Physical Exam Constitutional:      General: He is not in acute distress.    Appearance: He is well-developed. He is not toxic-appearing.  HENT:     Head: Normocephalic and atraumatic.  Abdominal:     General: There is no distension.     Palpations: Abdomen is soft.     Tenderness: There is no abdominal tenderness.  Musculoskeletal:     Cervical back: Normal range of motion and neck supple. No spinous process tenderness or muscular tenderness.     Comments: No obvious deformity, appreciable swelling, erythema, ecchymosis, significant open wounds, or increased warmth.  Back: No point/focal vertebral tenderness, no palpable step off or crepitus.  Tenderness to palpation right buttocks, right lumbar paraspinal muscles. Lower extremities: ranging @ all major joints. No focal bony tenderness .   Skin:    General: Skin is warm and dry.     Findings: No rash.  Neurological:     Mental Status: He is alert.     Comments: Sensation grossly intact to bilateral lower extremities. 5/5 symmetric strength with  plantar/dorsiflexion bilaterally. Gait is intact without obvious foot drop.  Straight leg raise of the right lower extremity.     ED Results / Procedures / Treatments   Labs (all labs ordered are listed, but only abnormal results are displayed) Labs Reviewed - No data to display  EKG None  Radiology No results found.  Procedures Procedures    Medications Ordered in ED Medications  ketorolac (TORADOL) 30 MG/ML injection 30 mg (has no administration in time range)  cyclobenzaprine (FLEXERIL) tablet 5 mg (has no administration in time range)  lidocaine (LIDODERM) 5 % 1 patch (has no administration in time range)    ED Course/ Medical Decision Making/ A&P                             Medical Decision Making Patient is a 52 year old male,  here for right-sided back pain has been going for the last couple days.  Occurred when he bent down to pick something up.  States that is shooting down his right leg.  He has positive straight leg raise, I am suspicious for sciatica given his symptoms Flexeril, and lidocaine patches.  We discussed return precautions and he voiced understanding.  Risk Prescription drug management.   Final Clinical Impression(s) / ED Diagnoses Final diagnoses:  Acute right-sided low back pain with right-sided sciatica    Rx / DC Orders ED Discharge Orders          Ordered    cyclobenzaprine (FLEXERIL) 5 MG tablet  3 times daily PRN        06/08/22 1344    lidocaine (HM LIDOCAINE PATCH) 4 %  Every 24 hours        06/08/22 1344    naproxen (NAPROSYN) 500 MG tablet  2 times daily PRN        06/08/22 1344              Averi Cacioppo, [place], [place] 06/08/22 1346    Lacretia Leigh, MD 06/12/22 1615

## 2022-06-08 NOTE — Discharge Instructions (Addendum)
Please follow with your primary care doctor.  Return to the ER if you have any loss of bowel, bladder fever, weakness in bilateral legs.  Take the naproxen, Flexeril, and lidocaine patches as needed for severe pain.  Do not take naproxen with other anti-inflammatories.

## 2022-06-08 NOTE — ED Triage Notes (Addendum)
Patient said for a few days he has had a stingy/poking near his right lower back/hip. He stated he believes he tweaked a muscle after bending down. No numbness or tingling or incontinence.

## 2022-06-09 ENCOUNTER — Other Ambulatory Visit (HOSPITAL_COMMUNITY): Payer: Self-pay

## 2022-06-13 ENCOUNTER — Encounter (INDEPENDENT_AMBULATORY_CARE_PROVIDER_SITE_OTHER): Payer: Self-pay | Admitting: Primary Care

## 2022-06-13 ENCOUNTER — Other Ambulatory Visit: Payer: Self-pay

## 2022-06-13 ENCOUNTER — Ambulatory Visit (INDEPENDENT_AMBULATORY_CARE_PROVIDER_SITE_OTHER): Payer: BC Managed Care – PPO | Admitting: Primary Care

## 2022-06-13 VITALS — BP 112/76 | HR 96 | Resp 16 | Wt 174.8 lb

## 2022-06-13 DIAGNOSIS — M545 Low back pain, unspecified: Secondary | ICD-10-CM

## 2022-06-13 MED ORDER — LIDOCAINE 4 % EX PTCH
1.0000 | MEDICATED_PATCH | CUTANEOUS | 1 refills | Status: AC
  Filled 2022-06-13: qty 30, 30d supply, fill #0

## 2022-06-13 MED ORDER — CYCLOBENZAPRINE HCL 5 MG PO TABS
5.0000 mg | ORAL_TABLET | Freq: Three times a day (TID) | ORAL | 0 refills | Status: AC | PRN
  Filled 2022-06-13 – 2022-06-14 (×2): qty 90, 30d supply, fill #0

## 2022-06-13 NOTE — Progress Notes (Signed)
[facility]  Subjective: CC: back pain PCP: Kerin Perna, NP HPI: Patient is a 52 y.o. Hispanic  male( interpreter Gertie Fey [phone])  presenting to clinic today for back pain. Concerns today include:  1. Back Pain Patient reports that pain began 05/30/22.  No h/o back pain.  Pain is a 6/10.  It does radiate.  Sitting , standing and lifting worsens pain.  Back brace and Lidoderm patch improves pain.  Patient has been taking naprosyn and flexeril for pain with good relief.  Patient denies trauma or injury.  Denies dysuria, hematuria, fevers, chills, nausea, vomiting, abdominal pain, renal stones. Aggravating factors: certain movements and prolonged walking/standing. Alleviating factors: rest. Progressive LE weakness or saddle anesthesia: none. Extremity sensation changes or weakness: none. Ambulatory without difficulty. Normal bowel/bladder habits: yes; without urinary retention. Normal PO intake without n/v. No associated abdominal pain/n/v. Self treatment: has OTC analgesics, with minimal relief. Patient denies: urinary retention/incontinence, bowel incontinence, weakness, falls, sensation changes or pain anywhere else. No h/o back surgeries. Using Chief Strategy Officer - showed proper body 6    Current Outpatient Medications:    albuterol (VENTOLIN HFA) 108 (90 Base) MCG/ACT inhaler, Inhale 2 puffs into the lungs every 6 (six) hours as needed for wheezing or shortness of breath., Disp: 8.5 g, Rfl: 11   cyclobenzaprine (FLEXERIL) 5 MG tablet, Take 1 tablet (5 mg total) by mouth 3 (three) times daily as needed for muscle spasms., Disp: 30 tablet, Rfl: 0   lidocaine (HM LIDOCAINE PATCH) 4 %, Place 1 patch onto the skin daily., Disp: 30 patch, Rfl: 0   montelukast (SINGULAIR) 10 MG tablet, Take 10 mg by mouth at bedtime., Disp: , Rfl:    naproxen (NAPROSYN) 500 MG tablet, Take 1 tablet (500 mg total) by mouth 2 (two) times daily as needed., Disp: 30 tablet, Rfl: 0   omeprazole  (PRILOSEC) 40 MG capsule, Take 40 mg by mouth daily., Disp: , Rfl:  No Known Allergies  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    Syncope 03/28/2016   Social History   Socioeconomic History   Marital status: Divorced    Spouse name: Not on file   Number of children: 1   Years of education: Not on file   Highest education level: Not on file  Occupational History   Not on file  Tobacco Use   Smoking status: Never    Passive exposure: Current   Smokeless tobacco: Never  Vaping Use   Vaping Use: Never used  Substance and Sexual Activity   Alcohol use: No   Drug use: No   Sexual activity: Yes  Other Topics Concern   Not on file  Social History Narrative   Not on file   Social Determinants of Health   Financial Resource Strain: Not on file  Food Insecurity: Not on file  Transportation Needs: Not on file  Physical Activity: Not on file  Stress: Not on file  Social Connections: Not on file  Intimate Partner Violence: Not on file   Past Surgical History:  Procedure Laterality Date   55 HOUR [place] STUDY N/A 05/08/2018   Procedure: [place];  Surgeon: Mauri Pole, MD;  Location: [facility];  Service: Endoscopy;  Laterality: N/A;   ESOPHAGEAL MANOMETRY N/A 05/08/2018   Procedure: ESOPHAGEAL MANOMETRY (EM);  Surgeon: Mauri Pole, MD;  Location: [facility];  Service: Endoscopy;  Laterality: N/A;   LEFT HEART CATH AND CORONARY ANGIOGRAPHY N/A 05/22/2016   Procedure: Left Heart Cath and Coronary Angiography;  Surgeon: Jettie Booze, MD;  Location: [facility];  Service: Cardiovascular;  Laterality: N/A;    ROS: per HPI  Objective: Blood Pressure 112/76   Pulse 96   Respiration 16   Weight 174 lb 12.8 oz (79.3 kg)   Oxygen Saturation 99%   Body Mass Index 27.38 kg/m    Physical Examination:  General: No apparent distress. Eyes: Extraocular eye movements intact,  pupils equal and round. Neck: Supple, trachea midline. Thyroid: No enlargement, mobile without fixation, no tenderness. Cardiovascular: Regular rhythm and rate, no murmur, normal radial pulses. Respiratory: Normal respiratory effort, clear to auscultation. Gastrointestinal: Normal pitch active bowel sounds, nontender abdomen without distention or appreciable hepatomegaly. Musculoskeletal: Normal muscle tone, no tenderness on palpation of tibia, no excessive thoracic kyphosis. Skin: Appropriate warmth, no visible rash. Mental status: Alert, conversant, speech clear, thought logical, appropriate mood and affect, no hallucinations or delusions evident. Hematologic/lymphatic: No cervical adenopathy, no visible ecchymoses.   Derek Smith was seen today for back pain.  Diagnoses and all orders for this visit:  Acute right-sided low back pain, unspecified whether sciatica present -     lidocaine (HM LIDOCAINE PATCH) 4 %; Place 1 patch onto the skin daily. -     Ambulatory referral to Orthopedic Surgery    The above assessment and management plan was discussed with the patient. The patient verbalized understanding of and has agreed to the management plan. Patient is aware to call the clinic if symptoms persist or worsen. Patient is aware when to return to the clinic for a follow-up visit. Patient educated on when it is appropriate to go to the emergency department.   This note has been created with Surveyor, quantity. Any transcriptional errors are unintentional.   Kerin Perna, NP 06/13/2022, 3:26 PM

## 2022-06-13 NOTE — Patient Instructions (Signed)
Dolor de espalda agudo en los adultos Acute Back Pain, Adult El dolor de espalda agudo es repentino y por lo general no dura mucho tiempo. Se debe generalmente a una lesin de los msculos y tejidos de la espalda. La lesin puede ser el resultado de: Estiramiento en exceso o desgarro de un msculo, tendn o ligamento. Los ligamentos son tejidos que conectan los huesos. Levantar algo de forma incorrecta puede producir un esguince de espalda. Desgaste (degeneracin) de los discos vertebrales. Los discos vertebrales son tejidos circulares que proporcionan amortiguacin entre los huesos de la columna vertebral (vrtebras). Movimientos de giro, como al practicar deportes o realizar trabajos de jardinera. Un golpe en la espalda. Artritis. Es posible que le realicen un examen fsico, anlisis de laboratorio u otros estudios de diagnstico por imgenes para encontrar la causa del dolor. El dolor de espalda agudo generalmente desaparece con reposo y cuidados en la casa. Siga estas instrucciones en su casa: Control del dolor, la rigidez y la hinchazn Use los medicamentos de venta libre y los recetados solamente como se lo haya indicado el mdico. El tratamiento puede incluir medicamentos para el dolor y la inflamacin que se toman por la boca o que se aplican sobre la piel, o relajantes musculares. El mdico puede recomendarle que se aplique hielo durante las primeras 24 a 48 horas despus del comienzo del dolor. Para hacer esto: Ponga el hielo en una bolsa plstica. Coloque una toalla entre la piel y la bolsa. Aplique el hielo durante 20 minutos, 2 o 3 veces por da. Retire el hielo si la piel se pone de color rojo brillante. Esto es muy importante. Si no puede sentir dolor, calor o fro, tiene un mayor riesgo de que se dae la zona. Si se lo indican, aplique calor en la zona afectada con la frecuencia que le haya indicado el mdico. Use la fuente de calor que el mdico le recomiende, como una compresa de  calor hmedo o una almohadilla trmica. Coloque una toalla entre la piel y la fuente de calor. Aplique calor durante 20 a 30 minutos. Retire la fuente de calor si la piel se pone de color rojo brillante. Esto es especialmente importante si no puede sentir dolor, calor o fro. Corre un mayor riesgo de sufrir quemaduras. Actividad  No permanezca en la cama. Hacer reposo en la cama por ms de 1 a 2 das puede demorar su recuperacin. Mantenga una buena postura al sentarse y pararse. No se incline hacia adelante al sentarse ni se encorve al pararse. Si trabaja en un escritorio, sintese cerca de este para no tener que inclinarse. Mantenga el mentn hacia abajo. Mantenga el cuello hacia atrs y los codos flexionados en un ngulo de 90 grados (ngulo recto). Cuando conduzca, sintese elevado y cerca del volante. Agregue un apoyo para la espalda (lumbar) al asiento del automvil, si es necesario. Realice caminatas cortas en superficies planas tan pronto como le sea posible. Trate de caminar un poco ms de tiempo cada da. No se siente, conduzca o permanezca de pie en un mismo lugar durante ms de 30 minutos seguidos. Pararse o sentarse durante largos perodos de tiempo puede sobrecargar la espalda. No conduzca ni use maquinaria pesada mientras toma analgsicos recetados. Use tcnicas apropiadas para levantar objetos. Cuando se inclina y levanta un objeto, utilice posiciones que no sobrecarguen tanto la espalda: Flexione las rodillas. Mantenga la carga cerca del cuerpo. No se tuerza. Haga actividad fsica habitualmente como se lo haya indicado el mdico. Hacer ejercicios ayuda   a que la espalda sane ms rpido y ayuda a evitar las lesiones de la espalda al mantener los msculos fuertes y flexibles. Trabaje con un fisioterapeuta para crear un programa de ejercicios seguros, segn lo recomiende el mdico. Haga ejercicios como se lo haya indicado el fisioterapeuta. Estilo de vida Mantenga un peso saludable.  El sobrepeso sobrecarga la espalda y hace que resulte difcil tener una buena postura. Evite actividades o situaciones que lo hagan sentirse ansioso o estresado. El estrs y la ansiedad aumentan la tensin muscular y pueden empeorar el dolor de espalda. Aprenda formas de manejar la ansiedad y el estrs, como a travs del ejercicio. Instrucciones generales Duerma sobre un colchn firme en una posicin cmoda. Intente acostarse de costado, con las rodillas ligeramente flexionadas. Si se recuesta sobre la espalda, coloque una almohada debajo de las rodillas. Mantenga la cabeza y el cuello en lnea recta con la columna vertebral (posicin neutra) cuando use equipos electrnicos como telfonos inteligentes o tablets. Para hacer esto: Levante el telfono inteligente o la tablet para mirarlo en lugar de inclinar la cabeza o el cuello para mirar hacia abajo. Coloque el telfono inteligente o la tablet al nivel de su cara mientras mira la pantalla. Siga el plan de tratamiento como se lo haya indicado el mdico. Esto puede incluir: Terapia cognitiva o conductual. Acupuntura o terapia de masajes. Yoga o meditacin. Comunquese con un mdico si: Siente un dolor que no se alivia con reposo o medicamentos. Siente mucho dolor que se extiende a las piernas o las nalgas. El dolor no mejora luego de 2 semanas. Siente dolor por la noche. Pierde peso sin proponrselo. Tiene fiebre o escalofros. Siente nuseas o vmitos. Siente dolor abdominal. Solicite ayuda de inmediato si: Tiene nuevos problemas para controlar la vejiga o los intestinos. Siente debilidad o adormecimiento inusuales en los brazos o en las piernas. Siente que va a desmayarse. Estos sntomas pueden representar un problema grave que constituye una emergencia. No espere a ver si los sntomas desaparecen. Solicite atencin mdica de inmediato. Comunquese con el servicio de emergencias de su localidad (911 en los [place]). No conduzca por sus  propios medios hasta el hospital. Resumen El dolor de espalda agudo es repentino y por lo general no dura mucho tiempo. Use tcnicas apropiadas para levantar objetos. Cuando se inclina y levanta un objeto, utilice posiciones que no sobrecarguen tanto la espalda. Tome los medicamentos de venta libre y los recetados solamente como se lo haya indicado el mdico, y aplquese calor o hielo segn las indicaciones. Esta informacin no tiene como fin reemplazar el consejo del mdico. Asegrese de hacerle al mdico cualquier pregunta que tenga. Document Revised: 06/16/2020 Document Reviewed: 06/16/2020 Elsevier Patient Education  2023 Elsevier Inc.  

## 2022-06-14 ENCOUNTER — Other Ambulatory Visit: Payer: Self-pay

## 2022-06-21 ENCOUNTER — Encounter: Payer: Self-pay | Admitting: Orthopedic Surgery

## 2022-06-21 ENCOUNTER — Ambulatory Visit (INDEPENDENT_AMBULATORY_CARE_PROVIDER_SITE_OTHER): Payer: BC Managed Care – PPO | Admitting: Orthopedic Surgery

## 2022-06-21 ENCOUNTER — Other Ambulatory Visit: Payer: Self-pay

## 2022-06-21 VITALS — BP 126/80 | HR 73 | Ht 67.0 in | Wt 175.0 lb

## 2022-06-21 DIAGNOSIS — M545 Low back pain, unspecified: Secondary | ICD-10-CM

## 2022-06-21 MED ORDER — ACETAMINOPHEN 500 MG PO TABS
1000.0000 mg | ORAL_TABLET | Freq: Three times a day (TID) | ORAL | 0 refills | Status: AC | PRN
  Filled 2022-06-21: qty 90, 15d supply, fill #0

## 2022-06-21 MED ORDER — METHYLPREDNISOLONE 4 MG PO TBPK
ORAL_TABLET | ORAL | 0 refills | Status: DC
  Filled 2022-06-21: qty 21, 6d supply, fill #0

## 2022-06-21 NOTE — Progress Notes (Signed)
Orthopedic Spine Surgery Office Note  Assessment: Patient is a 52 y.o. male with low back pain that radiates into the right lateral thigh.  Has a positive SLR.  Suspect HNP   Plan: -Explained that initially conservative treatment is tried as a significant number of patients may experience relief with these treatment modalities. Discussed that the conservative treatments include:  -activity modification  -physical therapy  -over the counter pain medications  -medrol dosepak  -lumbar steroid injections -Patient has tried cyclobenzaprine, naproxen -Recommended Medrol Dosepak and Tylenol.  Both were prescribed to him today.  I told him that he can continue the naproxen after he completes the Medrol Dosepak.  If the cyclobenzaprine is not helping him, he should stop that medication -If he is not doing any better at next visit, will recommend MRI of lumbar spine to evaluate for radiculopathy -Patient should return to office in 6 weeks, x-rays at next visit: None   Patient expressed understanding of the plan and all questions were answered to the patient's satisfaction.   ___________________________________________________________________________   History:  Patient is a 52 y.o. male who presents today for lumbar spine.  Patient has had 3 weeks of low back pain that radiates into his right lower extremity.  He states that he was bending over to pick something off the floor when he noted acute onset of pain.  It radiates into the right lateral thigh and does not radiate distal to the knee.  No pain on the left lower extremity.  Notes that lifting or bending over makes the pain worse.  Resting or laying down seems to help.   Weakness: Denies Symptoms of imbalance: Denies Paresthesias and numbness: Yes, gets paresthesias in the lateral right thigh.  No other numbness or paresthesias Bowel or bladder incontinence: Denies Saddle anesthesia: Denies  Treatments tried: Cyclobenzaprine,  naproxen  Review of systems: Denies fevers and chills, night sweats, unexplained weight loss, history of cancer, pain that wakes them at night  Past medical history: Migraines Asthma  Allergies: NKDA  Past surgical history:  None  Social history: Denies use of nicotine product (smoking, vaping, patches, smokeless) Alcohol use: Denies Denies recreational drug use   Physical Exam:  BMI of 27.4  General: no acute distress, appears stated age Neurologic: alert, answering questions appropriately, following commands Respiratory: unlabored breathing on room air, symmetric chest rise Psychiatric: appropriate affect, normal cadence to speech   MSK (spine):  -Strength exam      Left  Right EHL    5/5  5/5 TA    5/5  5/5 GSC    5/5  5/5 Knee extension  5/5  5/5 Hip flexion   5/5  5/5  -Sensory exam    Sensation intact to light touch in L3-S1 nerve distributions of bilateral lower extremities  -Achilles DTR: 2/4 on the left, 2/4 on the right -Patellar tendon DTR: 2/4 on the left, 2/4 on the right  -Straight leg raise: Positive -Contralateral straight leg raise: Negative -Femoral nerve stretch test: Negative bilaterally -Clonus: no beats bilaterally  -Left hip exam: No pain through range of motion, negative FADIR, negative FABER, negative Stinchfield -Right hip exam: No pain through range of motion, negative FADIR, negative FABER, negative Stinchfield  Imaging: XR of the lumbar spine from 06/21/2022 was independently reviewed and interpreted, showing no significant degenerative changes.  No evidence of instability on flexion/extension views.  No fracture or dislocation seen.   Patient name: Derek Smith Patient MRN: [id number] Date of visit: 06/21/22

## 2022-06-22 ENCOUNTER — Other Ambulatory Visit: Payer: Self-pay

## 2022-07-28 ENCOUNTER — Ambulatory Visit (INDEPENDENT_AMBULATORY_CARE_PROVIDER_SITE_OTHER): Payer: BC Managed Care – PPO | Admitting: Pulmonary Disease

## 2022-07-28 DIAGNOSIS — R053 Chronic cough: Secondary | ICD-10-CM | POA: Diagnosis not present

## 2022-07-28 LAB — PULMONARY FUNCTION TEST
FEF 25-75 Post: 2.95 L/sec
FEF 25-75 Pre: 2.82 L/sec
FEF2575-%Change-Post: 4 %
FEF2575-%Pred-Post: 93 %
FEF2575-%Pred-Pre: 89 %
FEV1-%Change-Post: 1 %
FEV1-%Pred-Post: 86 %
FEV1-%Pred-Pre: 85 %
FEV1-Post: 3.06 L
FEV1-Pre: 3.02 L
FEV1FVC-%Change-Post: 0 %
FEV1FVC-%Pred-Pre: 101 %
FEV6-%Change-Post: 0 %
FEV6-%Pred-Post: 88 %
FEV6-%Pred-Pre: 87 %
FEV6-Post: 3.86 L
FEV6-Pre: 3.83 L
FEV6FVC-%Change-Post: 0 %
FEV6FVC-%Pred-Post: 103 %
FEV6FVC-%Pred-Pre: 103 %
FVC-%Change-Post: 0 %
FVC-%Pred-Post: 85 %
FVC-%Pred-Pre: 84 %
FVC-Post: 3.87 L
FVC-Pre: 3.84 L
Post FEV1/FVC ratio: 79 %
Post FEV6/FVC ratio: 100 %
Pre FEV1/FVC ratio: 79 %
Pre FEV6/FVC Ratio: 100 %

## 2022-07-28 NOTE — Progress Notes (Signed)
Pre and Post Spiro performed today. 

## 2022-07-28 NOTE — Patient Instructions (Signed)
Pre and Post Spiro performed today. 

## 2022-08-02 ENCOUNTER — Ambulatory Visit (INDEPENDENT_AMBULATORY_CARE_PROVIDER_SITE_OTHER): Payer: BC Managed Care – PPO | Admitting: Orthopedic Surgery

## 2022-08-02 DIAGNOSIS — M5416 Radiculopathy, lumbar region: Secondary | ICD-10-CM

## 2022-08-03 ENCOUNTER — Encounter: Payer: Self-pay | Admitting: Pulmonary Disease

## 2022-08-03 ENCOUNTER — Ambulatory Visit (INDEPENDENT_AMBULATORY_CARE_PROVIDER_SITE_OTHER): Payer: BC Managed Care – PPO | Admitting: Pulmonary Disease

## 2022-08-03 VITALS — BP 110/78 | HR 77 | Temp 98.4°F | Ht 67.0 in | Wt 174.4 lb

## 2022-08-03 DIAGNOSIS — R053 Chronic cough: Secondary | ICD-10-CM | POA: Diagnosis not present

## 2022-08-03 NOTE — Patient Instructions (Signed)
Continue to use albuterol inhaler as needed  The cough appears to be mainly from reflux disease, continue pantoprazole daily  Your breathing tests are within normal limits.  Follow up as needed

## 2022-08-03 NOTE — Progress Notes (Signed)
Synopsis: Referred in January 2024 for chronic cough by Ileene Patrick, MD  Subjective:   PATIENT ID: Derek Smith GENDER: male DOB: 12-28-1970, MRN: [id number]  HPI  Chief Complaint  Patient presents with   Follow-up    Breathing has improved and his cough has resolved. Heart rate has been under control. He rarely uses his albuterol.    Ademir Berenguer is a 52 year old male, never smoker with asthma who returns to pulmonary clinic for chronic cough.   His cough has resolved. He last used albuterol as needed a month ago. Spirometry on 5/17 is within normal limits. No night time awakenings. He feels the cough mainly resolved with starting pantoprazole for reflux.   OV 04/17/22 His cough has resolved with prednisone taper and as needed albuterol inhaler. He was not able to pick up advair inhaler due to cost.   He has heart racing with exertional work over the past year. This is new for him. He has sweating with the heart racing. He denies chest pain. No family history of heart disease. He would prefer to be evaluated by a heart doctor.  MBS study was unremarkable.   Initial OV 03/17/22 He reports cough over the past year. The cough is dry. The cough does wake him up at night. He constantly feels like he has an itch in his throat. He has a sore throat. He denies post-nasal drainage. He denies symptoms from GERD. The cough is worse when eating. He feels like food and drink can stick in his throat. He denies issues with seasonal allergies. He has lived in [place] for 10 years. He was in [place] 6 months last year and the cough went away. He denies water damage or mold at home. He works in a lab with dusts and chemicals. He denies worsening in his cough while at work.   He is a never smoker or vaper.   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    Syncope 03/28/2016     Family History  Problem Relation Age  of Onset   GI problems Mother    Ovarian cancer Mother    Alzheimer's disease Father    Allergic rhinitis Neg Hx    Eczema Neg Hx    Asthma Neg Hx    Urticaria Neg Hx    Pancreatic cancer Neg Hx    Stomach cancer Neg Hx    Colon cancer Neg Hx    Esophageal cancer Neg Hx      Social History   Socioeconomic History   Marital status: Divorced    Spouse name: Not on file   Number of children: 1   Years of education: Not on file   Highest education level: Not on file  Occupational History   Not on file  Tobacco Use   Smoking status: Never    Passive exposure: Current   Smokeless tobacco: Never  Vaping Use   Vaping Use: Never used  Substance and Sexual Activity   Alcohol use: No   Drug use: No   Sexual activity: Yes  Other Topics Concern   Not on file  Social History Narrative   Not on file   Social Determinants of Health   Financial Resource Strain: Not on file  Food Insecurity: Not on file  Transportation Needs: Not on file  Physical Activity: Not on file  Stress: Not on file  Social Connections: Not on file  Intimate Partner Violence: Not on file     No Known Allergies   Outpatient Medications Prior to Visit  Medication Sig Dispense Refill   acetaminophen (TYLENOL) 500 MG tablet Take 2 tablets (1,000 mg total) by mouth every 8 (eight) hours as needed (pain). 90 tablet 0   albuterol (VENTOLIN HFA) 108 (90 Base) MCG/ACT inhaler Inhale 2 puffs into the lungs every 6 (six) hours as needed for wheezing or shortness of breath. 8.5 g 11   cyclobenzaprine (FLEXERIL) 5 MG tablet Take 1 tablet (5 mg total) by mouth 3 (three) times daily as needed for muscle spasms. 90 tablet 0   lidocaine (HM LIDOCAINE PATCH) 4 % Place 1 patch onto the skin daily. 30 patch 1   montelukast (SINGULAIR) 10 MG tablet Take 10 mg by mouth at bedtime.     naproxen (NAPROSYN) 500 MG tablet Take 1 tablet (500 mg total) by mouth 2 (two) times daily as needed. 30 tablet 0   omeprazole (PRILOSEC)  40 MG capsule Take 40 mg by mouth daily.     methylPREDNISolone (MEDROL DOSEPAK) 4 MG TBPK tablet Take as prescribed on the box 21 tablet 0   No facility-administered medications prior to visit.   Review of Systems  Constitutional:  Negative for chills, fever, malaise/fatigue and weight loss.  HENT:  Negative for congestion, sinus pain and sore throat.   Eyes: Negative.   Respiratory:  Negative for cough, hemoptysis, sputum production, shortness of breath and wheezing.   Cardiovascular:  Negative for chest pain, palpitations, orthopnea, claudication and leg swelling.  Gastrointestinal:  Negative for abdominal pain, heartburn, nausea and vomiting.  Genitourinary: Negative.   Musculoskeletal:  Negative for joint pain and myalgias.  Skin:  Negative for rash.  Neurological:  Negative for weakness.  Endo/Heme/Allergies: Negative.   Psychiatric/Behavioral: Negative.     Objective:   Vitals:   08/03/22 1549  BP: 110/78  Pulse: 77  Temp: 98.4 F (36.9 C)  TempSrc: Oral  SpO2: 95%  Weight: 174 lb 6.4 oz (79.1 kg)  Height: 5\' 7"  (1.702 m)   Physical Exam Constitutional:      General: He is not in acute distress. HENT:     Head: Normocephalic and atraumatic.     Mouth/Throat:     Mouth: Mucous membranes are moist.  Eyes:     Conjunctiva/sclera: Conjunctivae normal.  Cardiovascular:     Rate and Rhythm: Normal rate and regular rhythm.     Pulses: Normal pulses.     Heart sounds: Normal heart sounds. No murmur heard. Musculoskeletal:     Right lower leg: No edema.     Left lower leg: No edema.  Skin:    General: Skin is warm and dry.  Neurological:     General: No focal deficit present.     Mental Status: He is alert.    CBC    Component Value Date/Time   WBC 10.4 01/11/2021 0522   RBC 4.72 01/11/2021 0522   HGB 14.8 01/11/2021 0522   HGB 14.5 10/04/2016 1533   HCT 44.3 01/11/2021 0522   HCT 43.3 10/04/2016 1533   PLT 228 01/11/2021 0522   PLT 260 10/04/2016 1533    MCV 93.9 01/11/2021 0522   MCV 91 10/04/2016 1533   MCH 31.4 01/11/2021 0522   MCHC 33.4 01/11/2021 0522   RDW 11.6 01/11/2021 0522   RDW 12.7 10/04/2016 1533   LYMPHSABS 2.2 01/11/2021 0522   LYMPHSABS 2.3 10/04/2016 1533  MONOABS 1.0 01/11/2021 0522   EOSABS 0.0 01/11/2021 0522   EOSABS 0.2 10/04/2016 1533   BASOSABS 0.0 01/11/2021 0522   BASOSABS 0.0 10/04/2016 1533      Latest Ref Rng & Units 01/11/2021    5:22 AM 09/21/2017    2:15 PM 03/22/2017   12:12 PM  BMP  Glucose 70 - 99 mg/dL 161  096  94   BUN 6 - 20 mg/dL 16  13  16    Creatinine 0.61 - 1.24 mg/dL 0.45  4.09  8.11   Sodium 135 - 145 mmol/L 138  141  141   Potassium 3.5 - 5.1 mmol/L 3.0  3.6  4.3   Chloride 98 - 111 mmol/L 101  104  100   CO2 22 - 32 mmol/L 28     Calcium 8.9 - 10.3 mg/dL 9.3      Chest imaging: CXR 01/11/21 Lung volumes are normal. No consolidative airspace disease. No pleural effusions. No pneumothorax. No pulmonary nodule or mass noted. Pulmonary vasculature and the cardiomediastinal silhouette are within normal limits.  PFT:    Latest Ref Rng & Units 07/28/2022    3:16 PM  PFT Results  FVC-Pre L 3.84  P  FVC-Predicted Pre % 84  P  FVC-Post L 3.87  P  FVC-Predicted Post % 85  P  Pre FEV1/FVC % % 79  P  Post FEV1/FCV % % 79  P  FEV1-Pre L 3.02  P  FEV1-Predicted Pre % 85  P  FEV1-Post L 3.06  P    P Preliminary result  [facility]rom[facility]20 [facility]  Labs:  Path:  Echo:  Heart Catheterization:  MBS 04/03/22 Pt presented with a normal oropharyngeal swallow with adequate oral preparation/propulsion, timely swallow, good pharyngeal clearance and no aspiration/penetration noted with any consistencies assessed. These included: thin liquids, nectar-thick liquids, honey-thick liquids, puree, soft solids and barium tablet/thin liquids. No retrograde and/or regurgitation/UES restriction/decreased opening observed during this small sample; however, pt  could potentially be at risk intermittently for post prandial aspiration d/t reported esophageal symptoms. Pt may continue pt preferred solids/thin liquids with esophageal preautions in place during meals including upright positioning during and after meals for 30-45 minutes, small bites/sips and slow rate during PO consumption. Pt may benefit from ENT referral/assessment and/or further GI assessment for reported esophageal symptoms. Thank you for this consult.   Assessment & Plan:   Chronic cough  Discussion: Govanny Etheridge is a 52 year old male, never smoker with asthma who returns to pulmonary clinic for chronic cough.   His cough has resolved at this time. He attributes this to the pantoprazole with likely GERD leading to his cough. Spirometry is within normal limits.   He can continue to use albuterol as needed.   Follow up as needed.  Melody Comas, MD [facility] Office: [phone]   Current Outpatient Medications:    acetaminophen (TYLENOL) 500 MG tablet, Take 2 tablets (1,000 mg total) by mouth every 8 (eight) hours as needed (pain)., Disp: 90 tablet, Rfl: 0   albuterol (VENTOLIN HFA) 108 (90 Base) MCG/ACT inhaler, Inhale 2 puffs into the lungs every 6 (six) hours as needed for wheezing or shortness of breath., Disp: 8.5 g, Rfl: 11   cyclobenzaprine (FLEXERIL) 5 MG tablet, Take 1 tablet (5 mg total) by mouth 3 (three) times daily as needed for muscle spasms., Disp: 90 tablet, Rfl: 0   lidocaine (HM LIDOCAINE PATCH) 4 %, Place 1 patch  onto the skin daily., Disp: 30 patch, Rfl: 1   montelukast (SINGULAIR) 10 MG tablet, Take 10 mg by mouth at bedtime., Disp: , Rfl:    naproxen (NAPROSYN) 500 MG tablet, Take 1 tablet (500 mg total) by mouth 2 (two) times daily as needed., Disp: 30 tablet, Rfl: 0   omeprazole (PRILOSEC) 40 MG capsule, Take 40 mg by mouth daily., Disp: , Rfl:

## 2022-08-03 NOTE — Progress Notes (Signed)
Orthopedic Spine Surgery Office Note   Assessment: Patient is a 52 y.o. male who had low back pain that radiated into the right lateral thigh. Improved with medrol dosepak and tylenol     Plan: -Since he is no longer having any pain, did not recommend any further intervention at this time -Can use OTC medications as needed for pain relief -Patient should return to office on an as needed basis     Patient expressed understanding of the plan and all questions were answered to the patient's satisfaction.    ___________________________________________________________________________     History:   Patient is a 52 y.o. male who presents today for lumbar spine.  Patient was having low back pain that radiated into his right lateral thigh that was felt to be radicular in nature. A medrol dosepak and tylenol were prescribed by me at our last visit. After doing those treatments, his pain has improved significantly. His pain has resolved. Denies paresthesias and numbness.     Treatments tried: cyclobenzaprine, naproxen, tylenol, medrol dosepak    Physical Exam:   General: no acute distress, appears stated age Neurologic: alert, answering questions appropriately, following commands Respiratory: unlabored breathing on room air, symmetric chest rise Psychiatric: appropriate affect, normal cadence to speech     MSK (spine):   -Strength exam                                                   Left                  Right EHL                              5/5                  5/5 TA                                 5/5                  5/5 GSC                             5/5                  5/5 Knee extension            5/5                  5/5 Hip flexion                    5/5                  5/5   -Sensory exam                           Sensation intact to light touch in L3-S1 nerve distributions of bilateral lower extremities   -Achilles DTR: 2/4 on the left, 2/4 on the  right -Patellar tendon DTR: 2/4 on the left, 2/4 on the right   -Straight leg raise: negative bilaterally -Femoral nerve stretch test: negative bilaterally -Clonus: no beats bilaterally  Imaging: XR of the lumbar spine from 06/21/2022 was previously independently reviewed and interpreted, showing no significant degenerative changes.  No evidence of instability on flexion/extension views.  No fracture or dislocation seen.     Patient name: Derek Smith Patient MRN: [id number] Date of visit: 08/02/2022

## 2022-08-09 ENCOUNTER — Other Ambulatory Visit: Payer: Self-pay

## 2022-09-12 ENCOUNTER — Ambulatory Visit (INDEPENDENT_AMBULATORY_CARE_PROVIDER_SITE_OTHER): Payer: BC Managed Care – PPO | Admitting: Primary Care

## 2022-09-12 ENCOUNTER — Encounter (INDEPENDENT_AMBULATORY_CARE_PROVIDER_SITE_OTHER): Payer: Self-pay | Admitting: Primary Care

## 2022-09-12 VITALS — BP 100/68 | HR 82 | Resp 16 | Wt 173.4 lb

## 2022-09-12 DIAGNOSIS — E663 Overweight: Secondary | ICD-10-CM

## 2022-09-12 DIAGNOSIS — Z13220 Encounter for screening for lipoid disorders: Secondary | ICD-10-CM | POA: Diagnosis not present

## 2022-09-12 DIAGNOSIS — E876 Hypokalemia: Secondary | ICD-10-CM

## 2022-09-12 DIAGNOSIS — Z1159 Encounter for screening for other viral diseases: Secondary | ICD-10-CM

## 2022-09-12 DIAGNOSIS — Z0000 Encounter for general adult medical examination without abnormal findings: Secondary | ICD-10-CM | POA: Diagnosis not present

## 2022-09-12 DIAGNOSIS — Z114 Encounter for screening for human immunodeficiency virus [HIV]: Secondary | ICD-10-CM

## 2022-09-12 NOTE — Progress Notes (Signed)
[facility]       Patient presents to clinic today for annual exam.  Patient is fasting for labs.  Acute Concerns: None  Chronic Issues: Past Medical History:  Diagnosis Date  . Anxiety   . Asthma   . 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 Syncope 03/28/2016     Health Maintenance: Immunizations UTD HIV/Hep C Screening ordered today  Past Medical History:  Diagnosis Date  . Anxiety   . Asthma   . 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 Syncope 03/28/2016    Past Surgical History:  Procedure Laterality Date  . 24 HOUR PH STUDY N/A 05/08/2018   Procedure: 24 HOUR PH STUDY;  Surgeon: Napoleon Form, MD;  Location: [facility];  Service: Endoscopy;  Laterality: N/A;  . ESOPHAGEAL MANOMETRY N/A 05/08/2018   Procedure: ESOPHAGEAL MANOMETRY (EM);  Surgeon: Napoleon Form, MD;  Location: [facility];  Service: Endoscopy;  Laterality: N/A;  . LEFT HEART CATH AND CORONARY ANGIOGRAPHY N/A 05/22/2016   Procedure: Left Heart Cath and Coronary Angiography;  Surgeon: Corky Crafts, MD;  Location: [facility] INVASIVE CV LAB;  Service: Cardiovascular;  Laterality: N/A;    Current Outpatient Medications on File Prior to Visit  Medication Sig Dispense Refill  . acetaminophen (TYLENOL) 500 MG tablet Take 2 tablets (1,000 mg total) by mouth every 8 (eight) hours as needed (pain). 90 tablet 0  . albuterol (VENTOLIN HFA) 108 (90 Base) MCG/ACT inhaler Inhale 2 puffs into the lungs every 6 (six) hours as needed for wheezing or shortness of breath. 8.5 g 11  . cyclobenzaprine (FLEXERIL) 5 MG tablet Take 1 tablet (5 mg total) by mouth 3 (three) times daily as needed for muscle spasms. 90 tablet 0  . lidocaine (HM LIDOCAINE PATCH) 4 % Place 1 patch onto the skin daily. 30 patch 1  . naproxen (NAPROSYN) 500 MG tablet Take 1 tablet (500 mg total) by mouth 2 (two) times daily as needed. 30 tablet 0  . omeprazole (PRILOSEC) 40 MG capsule Take 40  mg by mouth daily.     No current facility-administered medications on file prior to visit.    No Known Allergies  Family History  Problem Relation Age of Onset  . GI problems Mother   . Ovarian cancer Mother   . Alzheimer's disease Father   . Allergic rhinitis Neg Hx   . Eczema Neg Hx   . Asthma Neg Hx   . Urticaria Neg Hx   . Pancreatic cancer Neg Hx   . Stomach cancer Neg Hx   . Colon cancer Neg Hx   . Esophageal cancer Neg Hx     Social History   Socioeconomic History  . Marital status: Divorced    Spouse name: Not on file  . Number of children: 1  . Years of education: Not on file  . Highest education level: Not on file  Occupational History  . Not on file  Tobacco Use  . Smoking status: Never    Passive exposure: Current  . Smokeless tobacco: Never  Vaping Use  . Vaping Use: Never used  Substance and Sexual Activity  . Alcohol use: No  . Drug use: No  . Sexual activity: Yes  Other Topics Concern  . Not on file  Social History Narrative  . Not on file   Social Determinants of Health   Financial Resource Strain: Not on file  Food Insecurity: Not on file  Transportation Needs: Not on file  Physical Activity: Not on file  Stress: Not on file  Social Connections: Not on file  Intimate Partner Violence: Not on file    ROS Comprehensive ROS Pertinent positive and negative noted in HPI   Blood Pressure 100/68   Pulse 82   Respiration 16   Weight 173 lb 6.4 oz (78.7 kg)   Oxygen Saturation 98%   Body Mass Index 27.16 kg/m   Physical Exam Vitals reviewed.  Constitutional:      Appearance: Normal appearance.  HENT:     Head: Normocephalic.     Right Ear: Tympanic membrane normal.     Left Ear: Tympanic membrane normal.     Nose: Nose normal.  Eyes:     Extraocular Movements: Extraocular movements intact.     Pupils: Pupils are equal, round, and reactive to light.  Cardiovascular:     Rate and Rhythm: Normal rate and regular rhythm.   Pulmonary:     Effort: Pulmonary effort is normal.     Breath sounds: Normal breath sounds.  Abdominal:     General: Bowel sounds are normal.  Musculoskeletal:        General: Normal range of motion.     Cervical back: Normal range of motion and neck supple.  Skin:    General: Skin is warm and dry.  Neurological:     Mental Status: He is oriented to person, place, and time.  Psychiatric:        Mood and Affect: Mood normal.        Behavior: Behavior normal.   Recent Results (from the past 2160 hour(s))  Pulmonary Function Test     Status: None   Collection Time: 07/28/22  3:16 PM  Result Value Ref Range   FVC-Pre 3.84 L   FVC-%Pred-Pre 84 %   FVC-Post 3.87 L   FVC-%Pred-Post 85 %   FVC-%Change-Post 0 %   FEV1-Pre 3.02 L   FEV1-%Pred-Pre 85 %   FEV1-Post 3.06 L   FEV1-%Pred-Post 86 %   FEV1-%Change-Post 1 %   FEV6-Pre 3.83 L   FEV6-%Pred-Pre 87 %   FEV6-Post 3.86 L   FEV6-%Pred-Post 88 %   FEV6-%Change-Post 0 %   Pre FEV1/FVC ratio 79 %   FEV1FVC-%Pred-Pre 101 %   Post FEV1/FVC ratio 79 %   FEV1FVC-%Change-Post 0 %   Pre FEV6/FVC Ratio 100 %   FEV6FVC-%Pred-Pre 103 %   Post FEV6/FVC ratio 100 %   FEV6FVC-%Pred-Post 103 %   FEV6FVC-%Change-Post 0 %   FEF 25-75 Pre 2.82 L/sec   FEF2575-%Pred-Pre 89 %   FEF 25-75 Post 2.95 L/sec   FEF2575-%Pred-Post 93 %   FEF2575-%Change-Post 4 %    Assessment/Plan:      This note has been created with Education officer, environmental. Any transcriptional errors are unintentional.   Grayce Sessions, NP 09/12/2022, 3:48 PM

## 2022-09-12 NOTE — Patient Instructions (Signed)
Culebrilla Shingles  La culebrilla es una infeccin. Causa una erupcin en la piel dolorosa y ampollas llenas de lquido. La culebrilla es causada por el mismo germen (virus) que causa la varicela. La culebrilla solo se presenta en personas que: Han tenido varicela. Han recibido una inyeccin (vacuna) para protegerse de la varicela. La culebrilla es poco frecuente en ese grupo. Cules son las causas? Esta afeccin es causada por el virus de la varicela zster. Este es el mismo germen que causa la varicela. Despus de que una persona se expone al germen, este permanece en el cuerpo, pero no est activo (latente). La culebrilla se presenta si el germen vuelve a estar activo (se reactiva). Esto puede ocurrir muchos aos despus de la primera exposicin al germen. Se desconoce qu hace que este germen vuelva a activarse. Qu incrementa el riesgo? Las personas que han tenido varicela o han recibido la vacuna contra la varicela estn en riesgo de tener culebrilla. Esta infeccin es ms frecuente en las personas que: Son mayores de 60 aos de edad. Tienen debilitado el sistema que combate las enfermedades (sistema inmunitario), por ejemplo, las personas que tienen: VIH (virus de inmunodeficiencia humana). SIDA (sndrome de inmunodeficiencia adquirida). Cncer. Reciben medicamentos que debilitan el sistema inmunitario, como los medicamentos que se indican en caso de trasplantes de rganos. Tienen mucho estrs. Cules son los signos o sntomas? Los primeros sntomas de la culebrilla pueden ser picazn, hormigueo o dolor en una zona de la piel. Algunos das o semanas despus, aparece una erupcin en la piel. Esto es lo que suele ocurrir: Es probable que la erupcin aparezca en un solo lado del cuerpo. Generalmente, la erupcin tiene la forma de una franja o banda. Con el paso del tiempo, la erupcin se convierte en un grupo de ampollas llenas de lquido. Las ampollas se rompern y se transformarn  en costras. Las costras suelen secarse en unas 2 o 3 semanas. Tambin puede tener: Fiebre. Escalofros. Dolor de cabeza. Ganas de vomitar (nuseas). Cmo se trata? La erupcin cutnea puede durar varias semanas. No hay una cura especfica para esta afeccin. El mdico puede recetarle medicamentos. Los medicamentos pueden: Ayudar a aliviar el dolor. Lograr una mejora ms pronto. Prevenir problemas a largo plazo. Ayudar a aliviar la picazn (antihistamnicos). Si la zona afectada est en el rostro, puede ser necesario que consulte a un especialista. Este puede ser un oftalmlogo o un mdico de nariz, garganta y odo (otorrinolaringlogo). Siga estas instrucciones en su casa: Medicamentos Tome los medicamentos de venta libre y los recetados solamente como se lo haya indicado el mdico. Pngase una crema para aliviar la picazn o para adormecer la zona donde tiene la erupcin, las ampollas o las costras. Hgalo como se lo haya indicado el mdico. Para ayudar a aliviar la picazn y las molestias  Pngase paos hmedos y fros (compresas fras) sobre la zona de la erupcin cutnea o las ampollas siguiendo las indicaciones del mdico. Los baos con agua fra pueden ayudarlo a sentirse mejor. Pruebe agregar bicarbonato de sodio o avena seca en el agua para aliviar la picazn. No se bae con agua caliente. Use locin de calamina como se lo haya indicado el mdico. Cuidado de las ampollas y la erupcin cutnea Mantenga la zona de la erupcin cutnea cubierta con una venda floja (vendaje). Use ropa holgada que no roce contra la erupcin. Lvese las manos con agua y jabn durante al menos 20 segundos antes y despus de cambiarse la venda. Use un desinfectante para manos   si no dispone de agua y jabn. Cambie la venda como se lo haya indicado el mdico. Mantenga la erupcin y las ampollas limpias. Para hacerlo, lave la zona con jabn suave y agua fra siguiendo las indicaciones del  mdico. Verifique la erupcin cutnea todos los das para detectar signos de infeccin. Est atento a los siguientes signos: Aumento del enrojecimiento, la hinchazn o el dolor. Lquido o sangre. Calor. Pus o mal olor. No se rasque el rea de la erupcin. No se toque las ampollas. Para evitar rascarse: Tenga las uas siempre cortas y limpias. Si no puede dejar de rascarse, use guantes o mitones mientras duerme. Instrucciones generales Haga reposo como se lo haya indicado el mdico. Lvese las manos regularmente con agua y jabn durante al menos 20 segundos. Use un desinfectante para manos si no dispone de agua y jabn. Al hacerlo, reduce sus probabilidades de contraer una infeccin en la piel. Su infeccin puede causar varicela en las personas que nunca tuvieron la enfermedad o que nunca recibieron la vacuna contra la varicela. Si tiene ampollas sobre las que an no se formaron costras, intente no tocar a otras personas ni estar cerca de otras personas, especialmente: Los bebs. Las mujeres embarazadas. Los nios que tienen zonas de piel enrojecida, spera o con picazn (eczema). Personas de edad avanzada que tienen un trasplante de rgano. Personas que tienen enfermedades a largo plazo (crnicas), como cncer o sndrome de inmunodeficiencia adquirida (SIDA). Concurra a todas las visitas de seguimiento. Cmo se evita? La vacuna es la mejor forma de prevenir la culebrilla y protegerse contra los problemas que provoca. Si no se ha vacunado, hable con su mdico sobre cmo recibir la vacuna. Dnde buscar ms informacin Centers for Disease Control and Prevention (Centros para el Control y la Prevencin de Enfermedades): www.cdc.gov Comunquese con un mdico si: El dolor no mejora con medicamentos. El dolor no mejora despus de que se cura la erupcin cutnea. Tiene cualquiera de estos signos de infeccin alrededor de la erupcin: Aumento del enrojecimiento, la hinchazn o el dolor. Lquido  o sangre. Calor. Pus o mal olor. Tiene fiebre. Solicite ayuda de inmediato si: La erupcin cutnea est en el rostro o en la nariz. Tiene dolor en el rostro o cerca de los ojos. Pierde la sensacin en un lado del rostro. Tiene dificultad para ver. Siente dolor o zumbido en los odos. Presenta prdida del gusto. Su afeccin empeora. Resumen La culebrilla causa una erupcin en la piel dolorosa y ampollas llenas de lquido. La culebrilla es causada por el mismo germen (virus) que causa la varicela. Mantenga la zona de la erupcin cutnea cubierta con una venda floja. Use ropa holgada que no roce contra la erupcin. Si tiene ampollas sobre las que an no se formaron costras, intente no tocar a otras personas ni estar cerca de otras personas. Esta informacin no tiene como fin reemplazar el consejo del mdico. Asegrese de hacerle al mdico cualquier pregunta que tenga. Document Revised: 03/19/2020 Document Reviewed: 03/19/2020 Elsevier Patient Education  2024 [employer]  

## 2022-09-13 ENCOUNTER — Telehealth (INDEPENDENT_AMBULATORY_CARE_PROVIDER_SITE_OTHER): Payer: Self-pay

## 2022-09-13 LAB — CMP14+EGFR
ALT: 24 IU/L (ref 0–44)
AST: 17 IU/L (ref 0–40)
Albumin: 4.6 g/dL (ref 3.8–4.9)
Alkaline Phosphatase: 68 IU/L (ref 44–121)
BUN/Creatinine Ratio: 16 (ref 9–20)
BUN: 18 mg/dL (ref 6–24)
Bilirubin Total: 0.3 mg/dL (ref 0.0–1.2)
CO2: 23 mmol/L (ref 20–29)
Calcium: 9.6 mg/dL (ref 8.7–10.2)
Chloride: 102 mmol/L (ref 96–106)
Creatinine, Ser: 1.15 mg/dL (ref 0.76–1.27)
Globulin, Total: 2.9 g/dL (ref 1.5–4.5)
Glucose: 96 mg/dL (ref 70–99)
Potassium: 4.2 mmol/L (ref 3.5–5.2)
Sodium: 140 mmol/L (ref 134–144)
Total Protein: 7.5 g/dL (ref 6.0–8.5)
eGFR: 77 mL/min/{1.73_m2} (ref >59)

## 2022-09-13 LAB — HCV INTERPRETATION

## 2022-09-13 LAB — SPECIMEN STATUS

## 2022-09-13 LAB — HCV AB W REFLEX TO QUANT PCR: HCV Ab: NONREACTIVE

## 2022-09-13 LAB — LIPID PANEL
Chol/HDL Ratio: 5 ratio (ref 0.0–5.0)
Cholesterol, Total: 224 mg/dL — ABNORMAL HIGH (ref 100–199)
HDL: 45 mg/dL (ref >39)
LDL Chol Calc (NIH): 142 mg/dL — ABNORMAL HIGH (ref 0–99)
Triglycerides: 203 mg/dL — ABNORMAL HIGH (ref 0–149)
VLDL Cholesterol Cal: 37 mg/dL (ref 5–40)

## 2022-09-13 LAB — SPECIMEN STATUS REPORT

## 2022-09-13 LAB — HIV ANTIBODY (ROUTINE TESTING W REFLEX): HIV Screen 4th Generation wRfx: NONREACTIVE

## 2022-09-13 NOTE — Telephone Encounter (Signed)
Copied from CRM [id number]. Topic: General - Inquiry >> Sep 13, 2022  3:48 PM Patsy Lager T wrote: Reason for CRM: Trina from [facility] is requesting a call back in reference to an extra sample that was sent to them and they need to know what the provider needs done with it. Please callback# [phone]

## 2022-09-15 NOTE — Telephone Encounter (Signed)
Returned call to [facility] don't recall the lady name I spoke with. But lab had been taken care of no action needed

## 2022-09-19 ENCOUNTER — Other Ambulatory Visit (INDEPENDENT_AMBULATORY_CARE_PROVIDER_SITE_OTHER): Payer: Self-pay | Admitting: Primary Care

## 2022-09-19 ENCOUNTER — Other Ambulatory Visit: Payer: Self-pay

## 2022-09-19 MED ORDER — ROSUVASTATIN CALCIUM 40 MG PO TABS
40.0000 mg | ORAL_TABLET | Freq: Every day | ORAL | 3 refills | Status: AC
  Filled 2022-09-19: qty 90, 90d supply, fill #0

## 2022-09-26 ENCOUNTER — Other Ambulatory Visit: Payer: Self-pay

## 2022-11-01 DIAGNOSIS — J4540 Moderate persistent asthma, uncomplicated: Secondary | ICD-10-CM | POA: Diagnosis not present

## 2022-11-01 DIAGNOSIS — E785 Hyperlipidemia, unspecified: Secondary | ICD-10-CM | POA: Diagnosis not present

## 2022-11-01 DIAGNOSIS — R002 Palpitations: Secondary | ICD-10-CM | POA: Diagnosis not present

## 2022-11-01 DIAGNOSIS — R29818 Other symptoms and signs involving the nervous system: Secondary | ICD-10-CM | POA: Diagnosis not present

## 2023-01-15 DIAGNOSIS — R002 Palpitations: Secondary | ICD-10-CM | POA: Diagnosis not present

## 2023-02-05 NOTE — Telephone Encounter (Signed)
error 

## 2023-02-07 DIAGNOSIS — R000 Tachycardia, unspecified: Secondary | ICD-10-CM | POA: Diagnosis not present

## 2023-06-12 DIAGNOSIS — Z23 Encounter for immunization: Secondary | ICD-10-CM | POA: Diagnosis not present

## 2023-06-12 DIAGNOSIS — M545 Low back pain, unspecified: Secondary | ICD-10-CM | POA: Diagnosis not present

## 2023-06-12 DIAGNOSIS — E785 Hyperlipidemia, unspecified: Secondary | ICD-10-CM | POA: Diagnosis not present

## 2023-06-12 DIAGNOSIS — R002 Palpitations: Secondary | ICD-10-CM | POA: Diagnosis not present

## 2023-06-13 DIAGNOSIS — Z114 Encounter for screening for human immunodeficiency virus [HIV]: Secondary | ICD-10-CM | POA: Diagnosis not present

## 2023-06-13 DIAGNOSIS — E785 Hyperlipidemia, unspecified: Secondary | ICD-10-CM | POA: Diagnosis not present

## 2023-06-13 DIAGNOSIS — Z1159 Encounter for screening for other viral diseases: Secondary | ICD-10-CM | POA: Diagnosis not present

## 2023-09-12 ENCOUNTER — Other Ambulatory Visit: Payer: Self-pay

## 2023-10-22 DIAGNOSIS — M5136 Other intervertebral disc degeneration, lumbar region with discogenic back pain only: Secondary | ICD-10-CM | POA: Diagnosis not present

## 2023-10-22 DIAGNOSIS — E785 Hyperlipidemia, unspecified: Secondary | ICD-10-CM | POA: Diagnosis not present

## 2023-10-22 DIAGNOSIS — G8929 Other chronic pain: Secondary | ICD-10-CM | POA: Diagnosis not present

## 2023-10-22 DIAGNOSIS — R0789 Other chest pain: Secondary | ICD-10-CM | POA: Diagnosis not present

## 2023-10-22 DIAGNOSIS — M48061 Spinal stenosis, lumbar region without neurogenic claudication: Secondary | ICD-10-CM | POA: Diagnosis not present

## 2023-10-22 DIAGNOSIS — M545 Low back pain, unspecified: Secondary | ICD-10-CM | POA: Diagnosis not present

## 2024-01-02 DIAGNOSIS — E119 Type 2 diabetes mellitus without complications: Secondary | ICD-10-CM | POA: Diagnosis not present

## 2024-01-02 DIAGNOSIS — E782 Mixed hyperlipidemia: Secondary | ICD-10-CM | POA: Diagnosis not present
